# Patient Record
Sex: Male | Born: 1960
Health system: Southern US, Community
[De-identification: ages and names within clinical notes are randomized; demographics above are authoritative.]

## PROBLEM LIST (undated history)

## (undated) DIAGNOSIS — M1990 Unspecified osteoarthritis, unspecified site: Secondary | ICD-10-CM

## (undated) DIAGNOSIS — M51369 Other intervertebral disc degeneration, lumbar region without mention of lumbar back pain or lower extremity pain: Secondary | ICD-10-CM

## (undated) DIAGNOSIS — M5136 Other intervertebral disc degeneration, lumbar region: Secondary | ICD-10-CM

## (undated) DIAGNOSIS — I10 Essential (primary) hypertension: Secondary | ICD-10-CM

## (undated) HISTORY — PX: EXPLORATORY LAPAROTOMY: SUR591

## (undated) HISTORY — DX: Other intervertebral disc degeneration, lumbar region without mention of lumbar back pain or lower extremity pain: M51.369

## (undated) HISTORY — DX: Other intervertebral disc degeneration, lumbar region: M51.36

## (undated) HISTORY — PX: HERNIA REPAIR: SHX51

## (undated) HISTORY — DX: Essential (primary) hypertension: I10

---

## 2011-07-07 LAB — HM COLONOSCOPY

## 2012-07-08 DIAGNOSIS — I10 Essential (primary) hypertension: Secondary | ICD-10-CM | POA: Insufficient documentation

## 2015-07-18 DIAGNOSIS — R1032 Left lower quadrant pain: Secondary | ICD-10-CM | POA: Insufficient documentation

## 2016-03-20 DIAGNOSIS — M5136 Other intervertebral disc degeneration, lumbar region: Secondary | ICD-10-CM | POA: Insufficient documentation

## 2016-03-20 DIAGNOSIS — Z789 Other specified health status: Secondary | ICD-10-CM | POA: Insufficient documentation

## 2017-01-08 DIAGNOSIS — M48061 Spinal stenosis, lumbar region without neurogenic claudication: Secondary | ICD-10-CM | POA: Insufficient documentation

## 2017-08-22 DIAGNOSIS — E785 Hyperlipidemia, unspecified: Secondary | ICD-10-CM | POA: Insufficient documentation

## 2019-10-26 ENCOUNTER — Telehealth: Payer: Self-pay | Admitting: General Practice

## 2019-10-26 NOTE — Telephone Encounter (Signed)
Patient hung up on me before I can let him know that we have new patient paperwork that needs to be completed. Patient was rude when he called in and stated that he will be out of his BP medication. Stated he was going to try to get an emergency supply at urgent care and hung up.

## 2019-11-01 ENCOUNTER — Encounter: Payer: Self-pay | Admitting: Family Medicine

## 2019-11-01 ENCOUNTER — Ambulatory Visit (INDEPENDENT_AMBULATORY_CARE_PROVIDER_SITE_OTHER): Payer: Self-pay | Admitting: Family Medicine

## 2019-11-01 ENCOUNTER — Other Ambulatory Visit: Payer: Self-pay

## 2019-11-01 VITALS — BP 165/100 | HR 88 | Temp 97.6°F | Wt 225.0 lb

## 2019-11-01 DIAGNOSIS — M48061 Spinal stenosis, lumbar region without neurogenic claudication: Secondary | ICD-10-CM

## 2019-11-01 DIAGNOSIS — M25561 Pain in right knee: Secondary | ICD-10-CM | POA: Insufficient documentation

## 2019-11-01 DIAGNOSIS — M5416 Radiculopathy, lumbar region: Secondary | ICD-10-CM

## 2019-11-01 DIAGNOSIS — G8929 Other chronic pain: Secondary | ICD-10-CM

## 2019-11-01 DIAGNOSIS — I10 Essential (primary) hypertension: Secondary | ICD-10-CM

## 2019-11-01 MED ORDER — LISINOPRIL 10 MG PO TABS: 10.0000 mg | ORAL_TABLET | Freq: Every day | ORAL | 1 refills | Status: DC

## 2019-11-01 MED ORDER — BISOPROLOL-HYDROCHLOROTHIAZIDE 10-6.25 MG PO TABS: 1.0000 | ORAL_TABLET | Freq: Every day | ORAL | 1 refills | Status: DC

## 2019-11-01 NOTE — Assessment & Plan Note (Signed)
Recommend trying voltaren gel.  Also discussed trying injection, which he will think about once he is insured.

## 2019-11-01 NOTE — Patient Instructions (Addendum)
Clients coming to [facility] may have a number of questions.  If you have any additional questions regarding any aspect of the [facility] experience, please call us at [phone].  Have labs completed, we'll be in touch with results.   See me again in about 2 months.     Alcohol Abuse and Dependence Information, Adult Alcohol is a widely available drug. People drink alcohol in different amounts. People who drink alcohol very often and in large amounts often have problems during and after drinking. They may develop what is called an alcohol use disorder. There are two main types of alcohol use disorders:  Alcohol abuse. This is when you use alcohol too much or too often. You may use alcohol to make yourself feel happy or to reduce stress. You may have a hard time setting a limit on the amount you drink.  Alcohol dependence. This is when you use alcohol consistently for a period of time, and your body changes as a result. This can make it hard to stop drinking because you may start to feel sick or feel different when you do not use alcohol. These symptoms are known as withdrawal. How can alcohol abuse and dependence affect me? Alcohol abuse and dependence can have a negative effect on your life. Drinking too much can lead to addiction. You may feel like you need alcohol to function normally. You may drink alcohol before work in the morning, during the day, or as soon as you get home from work in the evening. These actions can result in:  Poor work performance.  Job loss.  Financial problems.  Car crashes or criminal charges from driving after drinking alcohol.  Problems in your relationships with friends and family.  Losing the trust and respect of coworkers, friends, and family. Drinking heavily over a long period of time can permanently damage your body and brain, and can cause lifelong health issues, such as:  Damage to your liver or pancreas.  Heart problems, high blood pressure, or  stroke.  Certain cancers.  Decreased ability to fight infections.  Brain or nerve damage.  Depression.  Early (premature) death. If you are careless or you crave alcohol, it is easy to drink more than your body can handle (overdose). Alcohol overdose is a serious situation that requires hospitalization. It may lead to permanent injuries or death. What can increase my risk?  Having a family history of alcohol abuse.  Having depression or other mental health conditions.  Beginning to drink at an early age.  Binge drinking often.  Experiencing trauma, stress, and an unstable home life during childhood.  Spending time with people who drink often. What actions can I take to prevent or manage alcohol abuse and dependence?  Do not drink alcohol if: ? Your health care provider tells you not to drink. ? You are pregnant, may be pregnant, or are planning to become pregnant.  If you drink alcohol: ? Limit how much you use to:  0-1 drink a day for women.  0-2 drinks a day for men. ? Be aware of how much alcohol is in your drink. In the [place] one drink equals one 12 oz bottle of beer (355 mL), one 5 oz glass of wine (148 mL), or one 1 oz glass of hard liquor (44 mL).  Stop drinking if you have been drinking too much. This can be very hard to do if you are used to abusing alcohol. If you begin to have withdrawal symptoms, talk with your health  care provider or a person that you trust. These symptoms may include anxiety, shaky hands, headache, nausea, sweating, or not being able to sleep.  Choose to drink nonalcoholic beverages in social gatherings and places where there may be alcohol. Activity  Spend more time on activities that you enjoy that do not involve alcohol, like hobbies or exercise.  Find healthy ways to cope with stress, such as exercise, meditation, or spending time with people you care about. General information  Talk to your family, coworkers, and friends about  supporting you in your efforts to stop drinking. If they drink, ask them not to drink around you. Spend more time with people who do not drink alcohol.  If you think that you have an alcohol dependency problem: ? Tell friends or family about your concerns. ? Talk with your health care provider or another health professional about where to get help. ? Work with a Transport planner and a Regulatory affairs officer. ? Consider joining a support group for people who struggle with alcohol abuse and dependence. Where to find support   Your health care provider.  SMART Recovery: www.smartrecovery.org Therapy and support groups  Local treatment centers or chemical dependency counselors.  Local AA groups in your community: NicTax.com.pt Where to find more information  Centers for Disease Control and Prevention: http://www.wolf.info/  National Institute on Alcohol Abuse and Alcoholism: http://www.bradshaw.com/  Alcoholics Anonymous (AA): NicTax.com.pt Contact a health care provider if:  You drank more or for longer than you intended on more than one occasion.  You tried to stop drinking or to cut back on how much you drink, but you were not able to.  You often drink to the point of vomiting or passing out.  You want to drink so badly that you cannot think about anything else.  You have problems in your life due to drinking, but you continue to drink.  You keep drinking even though you feel anxious, depressed, or have experienced memory loss.  You have stopped doing the things you used to enjoy in order to drink.  You have to drink more than you used to in order to get the effect you want.  You experience anxiety, sweating, nausea, shakiness, and trouble sleeping when you try to stop drinking. Get help right away if:  You have thoughts about hurting yourself or others.  You have serious withdrawal symptoms, including: ? Confusion. ? Racing heart. ? High blood pressure. ? Fever. If you ever feel like  you may hurt yourself or others, or have thoughts about taking your own life, get help right away. You can go to your nearest emergency department or call:  Your local emergency services (911 in the [place]  A suicide crisis helpline, such as the [place] at (332) 267-4142. This is open 24 hours a day. Summary  Alcohol abuse and dependence can have a negative effect on your life. Drinking too much or too often can lead to addiction.  If you drink alcohol, limit how much you use.  If you are having trouble keeping your drinking under control, find ways to change your behavior. Hobbies, calming activities, exercise, or support groups can help.  If you feel you need help with changing your drinking habits, talk with your health care provider, a good friend, or a therapist, or go to an [place] group. This information is not intended to replace advice given to you by your health care provider. Make sure you discuss any questions you have with your health  care provider. Document Revised: 07/26/2018 Document Reviewed: 06/14/2018 Elsevier Patient Education  [place].

## 2019-11-01 NOTE — Assessment & Plan Note (Signed)
He is controlling with anti-inflammatories as needed.  Has rx for gabapentin but doesn't use very often.

## 2019-11-01 NOTE — Progress Notes (Signed)
Ian [name] - 59 y.o. male MRN [id number]  Date of birth: 28-Dec-1960  Subjective Chief Complaint  Patient presents with  . Establish Care  . Medication Refill    HPI Ian [name] is a 59 y.o. male with history of HTN, chronic low back and knee pain here today for initial visit.    -HTN:  Current management with lisinopril 10mg  and bisoprolol-hctz.  He has been out of medication for a couple of days.  He denies side effects fro medication.  He has not had chest pain, shortness of breath, palpitations, headache or vision changes.   -Back pain:  He tells me that he is disabled due to his back pain.  Reports dx with DDD of the lumbar spine. He has tried anti-inflammatories and injections without much improvement previously.  He has never had surgery.    -Knee pain:  Pain in R knee with some intermittent stiffness and swelling.  Controls with OTC ibuprofen, this is helpful.  He has never tried injection.    ROS:  A comprehensive ROS was completed and negative except as noted per HPI  Not on File  Past Medical History:  Diagnosis Date  . Degenerative disc disease, lumbar   . Hypertension     Past Surgical History:  Procedure Laterality Date  . EXPLORATORY LAPAROTOMY      Social History   Socioeconomic History  . Marital status: Unknown    Spouse name: Not on file  . Number of children: Not on file  . Years of education: Not on file  . Highest education level: Not on file  Occupational History  . Not on file  Tobacco Use  . Smoking status: Former Research scientist (life sciences)  . Smokeless tobacco: Never Used  Vaping Use  . Vaping Use: Never used  Substance and Sexual Activity  . Alcohol use: Yes    Alcohol/week: 2.0 standard drinks    Types: 1 Shots of liquor, 1 Cans of beer per week    Comment: Daily  . Drug use: Never  . Sexual activity: Not Currently  Other Topics Concern  . Not on file  Social History Narrative  . Not on file   Social Determinants of Health   Financial Resource  Strain:   . Difficulty of Paying Living Expenses:   Food Insecurity:   . Worried About Charity fundraiser in the Last Year:   . Arboriculturist in the Last Year:   Transportation Needs:   . Film/video editor (Medical):   Marland Kitchen Lack of Transportation (Non-Medical):   Physical Activity:   . Days of Exercise per Week:   . Minutes of Exercise per Session:   Stress:   . Feeling of Stress :   Social Connections:   . Frequency of Communication with Friends and Family:   . Frequency of Social Gatherings with Friends and Family:   . Attends Religious Services:   . Active Member of Clubs or Organizations:   . Attends Archivist Meetings:   Marland Kitchen Marital Status:     Family History  Problem Relation Age of Onset  . Diabetes Mother   . Colon cancer Mother   . Hypertension Father   . Heart attack Father     Health Maintenance  Topic Date Due  . Hepatitis C Screening  Never done  . COVID-19 Vaccine (1) Never done  . HIV Screening  Never done  . TETANUS/TDAP  Never done  . COLONOSCOPY  Never done  . INFLUENZA  VACCINE  11/19/2019     ----------------------------------------------------------------------------------------------------------------------------------------------------------------------------------------------------------------- Physical Exam BP (!) 165/100 (BP Location: Right Arm, Patient Position: Sitting, Cuff Size: Large)   Pulse 88   Temp 97.6 F (36.4 C) (Oral)   Wt 225 lb (102.1 kg)   SpO2 99%   Physical Exam Constitutional:      Appearance: Normal appearance.  HENT:     Head: Normocephalic and atraumatic.  Eyes:     General: No scleral icterus. Cardiovascular:     Rate and Rhythm: Normal rate and regular rhythm.  Pulmonary:     Effort: Pulmonary effort is normal.     Breath sounds: Normal breath sounds.  Musculoskeletal:     Cervical back: Neck supple.     Comments: TTP along joint line of R knee. No effusion noted.   ROM is fairly good but  pain on extreme flexion.    Neurological:     General: No focal deficit present.     Mental Status: He is alert.  Psychiatric:        Mood and Affect: Mood normal.     ------------------------------------------------------------------------------------------------------------------------------------------------------------------------------------------------------------------- Assessment and Plan  Spinal stenosis of lumbar region with radiculopathy He is controlling with anti-inflammatories as needed.  Has rx for gabapentin but doesn't use very often.   Essential hypertension Blood pressure is not at goal at for age and co-morbidities. He is currently out of medications so I recommended that he restart these initially.  In addition they were instructed to follow a low sodium diet with regular exercise to help to maintain adequate control of blood pressure.    Right knee pain Recommend trying voltaren gel.  Also discussed trying injection, which he will think about once he is insured.     Meds ordered this encounter  Medications  . bisoprolol-hydrochlorothiazide (ZIAC) 10-6.25 MG tablet    Sig: Take 1 tablet by mouth daily.    Dispense:  90 tablet    Refill:  1  . lisinopril (ZESTRIL) 10 MG tablet    Sig: Take 1 tablet (10 mg total) by mouth daily.    Dispense:  90 tablet    Refill:  1    Return in about 2 months (around 01/02/2020) for HTN.    This visit occurred during the SARS-CoV-2 public health emergency.  Safety protocols were in place, including screening questions prior to the visit, additional usage of staff PPE, and extensive cleaning of exam room while observing appropriate contact time as indicated for disinfecting solutions.

## 2019-11-01 NOTE — Assessment & Plan Note (Signed)
Blood pressure is not at goal at for age and co-morbidities. He is currently out of medications so I recommended that he restart these initially.  In addition they were instructed to follow a low sodium diet with regular exercise to help to maintain adequate control of blood pressure.

## 2019-12-21 LAB — CBC
HCT: 47.6 % (ref 38.5–50.0)
Hemoglobin: 16.1 g/dL (ref 13.2–17.1)
MCH: 29.4 pg (ref 27.0–33.0)
MCHC: 33.8 g/dL (ref 32.0–36.0)
MCV: 87 fL (ref 80.0–100.0)
MPV: 11.1 fL (ref 7.5–12.5)
Platelets: 175 10*3/uL (ref 140–400)
RBC: 5.47 10*6/uL (ref 4.20–5.80)
RDW: 12.9 % (ref 11.0–15.0)
WBC: 5.6 10*3/uL (ref 3.8–10.8)

## 2019-12-21 LAB — COMPLETE METABOLIC PANEL WITH GFR
AG Ratio: 1.7 (calc) (ref 1.0–2.5)
ALT: 30 U/L (ref 9–46)
AST: 18 U/L (ref 10–35)
Albumin: 4.3 g/dL (ref 3.6–5.1)
Alkaline phosphatase (APISO): 51 U/L (ref 35–144)
BUN: 13 mg/dL (ref 7–25)
CO2: 30 mmol/L (ref 20–32)
Calcium: 9.8 mg/dL (ref 8.6–10.3)
Chloride: 104 mmol/L (ref 98–110)
Creat: 0.88 mg/dL (ref 0.70–1.33)
GFR, Est African American: 109 mL/min/{1.73_m2} (ref >=60)
GFR, Est Non African American: 94 mL/min/{1.73_m2} (ref >=60)
Globulin: 2.5 g/dL (calc) (ref 1.9–3.7)
Glucose, Bld: 91 mg/dL (ref 65–99)
Potassium: 4.4 mmol/L (ref 3.5–5.3)
Sodium: 140 mmol/L (ref 135–146)
Total Bilirubin: 0.4 mg/dL (ref 0.2–1.2)
Total Protein: 6.8 g/dL (ref 6.1–8.1)

## 2020-01-02 ENCOUNTER — Other Ambulatory Visit: Payer: Self-pay

## 2020-01-02 ENCOUNTER — Encounter: Payer: Self-pay | Admitting: Family Medicine

## 2020-01-02 ENCOUNTER — Ambulatory Visit (INDEPENDENT_AMBULATORY_CARE_PROVIDER_SITE_OTHER): Payer: Medicare Other | Admitting: Family Medicine

## 2020-01-02 VITALS — BP 117/78 | HR 60 | Wt 231.7 lb

## 2020-01-02 DIAGNOSIS — R351 Nocturia: Secondary | ICD-10-CM

## 2020-01-02 DIAGNOSIS — I10 Essential (primary) hypertension: Secondary | ICD-10-CM | POA: Diagnosis not present

## 2020-01-02 MED ORDER — LISINOPRIL 10 MG PO TABS: 10.0000 mg | ORAL_TABLET | Freq: Every day | ORAL | 1 refills | Status: DC

## 2020-01-02 MED ORDER — BISOPROLOL-HYDROCHLOROTHIAZIDE 10-6.25 MG PO TABS: 1.0000 | ORAL_TABLET | Freq: Every day | ORAL | 1 refills | Status: DC

## 2020-01-02 NOTE — Progress Notes (Signed)
Ian [name] - 59 y.o. male MRN [id number]  Date of birth: 03-25-1961  Subjective Chief Complaint  Patient presents with  . Hypertension    HPI Ian [name] is a 58 y.o. male here today for follow up of HTN.  He was out of BP medication at previous appt.  He has restarted on medications.  He is tolerating well and BP is much better controlled at this time.  He has also cut back on his EtOH intake which seems to help his BP as well.   He is concerned about his prostate due to urinary frequency at night .  He denies needing to strain or pain with urination.   ROS:  A comprehensive ROS was completed and negative except as noted per HPI  No Known Allergies  Past Medical History:  Diagnosis Date  . Degenerative disc disease, lumbar   . Hypertension     Past Surgical History:  Procedure Laterality Date  . EXPLORATORY LAPAROTOMY      Social History   Socioeconomic History  . Marital status: Unknown    Spouse name: Not on file  . Number of children: Not on file  . Years of education: Not on file  . Highest education level: Not on file  Occupational History  . Not on file  Tobacco Use  . Smoking status: Former Research scientist (life sciences)  . Smokeless tobacco: Never Used  Vaping Use  . Vaping Use: Never used  Substance and Sexual Activity  . Alcohol use: Yes    Alcohol/week: 2.0 standard drinks    Types: 1 Shots of liquor, 1 Cans of beer per week    Comment: Daily  . Drug use: Never  . Sexual activity: Not Currently  Other Topics Concern  . Not on file  Social History Narrative  . Not on file   Social Determinants of Health   Financial Resource Strain:   . Difficulty of Paying Living Expenses: Not on file  Food Insecurity:   . Worried About Charity fundraiser in the Last Year: Not on file  . Ran Out of Food in the Last Year: Not on file  Transportation Needs:   . Lack of Transportation (Medical): Not on file  . Lack of Transportation (Non-Medical): Not on file  Physical Activity:    . Days of Exercise per Week: Not on file  . Minutes of Exercise per Session: Not on file  Stress:   . Feeling of Stress : Not on file  Social Connections:   . Frequency of Communication with Friends and Family: Not on file  . Frequency of Social Gatherings with Friends and Family: Not on file  . Attends Religious Services: Not on file  . Active Member of Clubs or Organizations: Not on file  . Attends Archivist Meetings: Not on file  . Marital Status: Not on file    Family History  Problem Relation Age of Onset  . Diabetes Mother   . Colon cancer Mother   . Hypertension Father   . Heart attack Father     Health Maintenance  Topic Date Due  . Hepatitis C Screening  Never done  . COVID-19 Vaccine (1) Never done  . HIV Screening  Never done  . TETANUS/TDAP  Never done  . COLONOSCOPY  Never done  . INFLUENZA VACCINE  11/19/2019     ----------------------------------------------------------------------------------------------------------------------------------------------------------------------------------------------------------------- Physical Exam BP 117/78 (BP Location: Left Arm, Patient Position: Sitting, Cuff Size: Normal)   Pulse 60   Wt 231 lb  11.2 oz (105.1 kg)   SpO2 94%   Physical Exam Constitutional:      Appearance: Normal appearance.  HENT:     Head: Normocephalic and atraumatic.  Eyes:     General: No scleral icterus. Cardiovascular:     Rate and Rhythm: Normal rate and regular rhythm.  Pulmonary:     Effort: Pulmonary effort is normal.     Breath sounds: Normal breath sounds.  Musculoskeletal:     Cervical back: Neck supple.  Skin:    General: Skin is warm and dry.  Neurological:     General: No focal deficit present.     Mental Status: He is alert.  Psychiatric:        Mood and Affect: Mood normal.        Behavior: Behavior normal.      ------------------------------------------------------------------------------------------------------------------------------------------------------------------------------------------------------------------- Assessment and Plan  Essential hypertension Blood pressure is at goal at for age and co-morbidities.  I recommend continuation of current medications.  In addition they were instructed to follow a low sodium diet with regular exercise to help to maintain adequate control of blood pressure.    Nocturia Check PSA today.  Discussed adding flomax as well.     Meds ordered this encounter  Medications  . bisoprolol-hydrochlorothiazide (ZIAC) 10-6.25 MG tablet    Sig: Take 1 tablet by mouth daily.    Dispense:  90 tablet    Refill:  1  . lisinopril (ZESTRIL) 10 MG tablet    Sig: Take 1 tablet (10 mg total) by mouth daily.    Dispense:  90 tablet    Refill:  1   Orders Placed This Encounter  Procedures  . PSA     Return in about 6 months (around 07/01/2020) for HTN.    This visit occurred during the SARS-CoV-2 public health emergency.  Safety protocols were in place, including screening questions prior to the visit, additional usage of staff PPE, and extensive cleaning of exam room while observing appropriate contact time as indicated for disinfecting solutions.

## 2020-01-02 NOTE — Assessment & Plan Note (Signed)
Blood pressure is at goal at for age and co-morbidities.  I recommend continuation of current medications.  In addition they were instructed to follow a low sodium diet with regular exercise to help to maintain adequate control of blood pressure.  ? ?

## 2020-01-02 NOTE — Assessment & Plan Note (Signed)
Check PSA today.  Discussed adding flomax as well.

## 2020-01-03 LAB — PSA: PSA: 0.66 ng/mL (ref <=4.0)

## 2020-07-01 ENCOUNTER — Encounter (INDEPENDENT_AMBULATORY_CARE_PROVIDER_SITE_OTHER): Payer: Self-pay

## 2020-07-01 ENCOUNTER — Other Ambulatory Visit: Payer: Self-pay

## 2020-07-01 ENCOUNTER — Encounter: Payer: Self-pay | Admitting: Family Medicine

## 2020-07-01 ENCOUNTER — Ambulatory Visit (INDEPENDENT_AMBULATORY_CARE_PROVIDER_SITE_OTHER): Payer: Medicare Other | Admitting: Family Medicine

## 2020-07-01 DIAGNOSIS — I10 Essential (primary) hypertension: Secondary | ICD-10-CM

## 2020-07-01 MED ORDER — LISINOPRIL 10 MG PO TABS: 10.0000 mg | ORAL_TABLET | Freq: Every day | ORAL | 2 refills | Status: DC

## 2020-07-01 MED ORDER — BISOPROLOL-HYDROCHLOROTHIAZIDE 10-6.25 MG PO TABS: 1.0000 | ORAL_TABLET | Freq: Every day | ORAL | 2 refills | Status: DC

## 2020-07-01 NOTE — Assessment & Plan Note (Signed)
Blood pressure is at goal at for age and co-morbidities.  I recommend continuation of current medication.  In addition they were instructed to follow a low sodium diet with regular exercise to help to maintain adequate control of blood pressure.

## 2020-07-01 NOTE — Patient Instructions (Signed)
Nice to see you today!  Please continue current medications.   See me again in about 6 months.

## 2020-07-01 NOTE — Progress Notes (Signed)
Ian [name] - 60 y.o. male MRN [id number]  Date of birth: [birth year]  Subjective Chief Complaint  Patient presents with  . Hypertension    HPI Ian [name] is a 60 y.o. male here today for follow up of HTN.  He reports that he is doing well with current medications of bisoprolol/hctz and lisinopril.  He has not had side effects related to medication.  He denies symptoms of HTN including chest pain, shortness of breath, palpitations, headache or vision changes.    ROS:  A comprehensive ROS was completed and negative except as noted per HPI  No Known Allergies  Past Medical History:  Diagnosis Date  . Degenerative disc disease, lumbar   . Hypertension     Past Surgical History:  Procedure Laterality Date  . EXPLORATORY LAPAROTOMY      Social History   Socioeconomic History  . Marital status: Unknown    Spouse name: Not on file  . Number of children: Not on file  . Years of education: Not on file  . Highest education level: Not on file  Occupational History  . Not on file  Tobacco Use  . Smoking status: Former Research scientist (life sciences)  . Smokeless tobacco: Never Used  Vaping Use  . Vaping Use: Never used  Substance and Sexual Activity  . Alcohol use: Yes    Alcohol/week: 2.0 standard drinks    Types: 1 Shots of liquor, 1 Cans of beer per week    Comment: Daily  . Drug use: Never  . Sexual activity: Not Currently  Other Topics Concern  . Not on file  Social History Narrative  . Not on file   Social Determinants of Health   Financial Resource Strain: Not on file  Food Insecurity: Not on file  Transportation Needs: Not on file  Physical Activity: Not on file  Stress: Not on file  Social Connections: Not on file    Family History  Problem Relation Age of Onset  . Diabetes Mother   . Colon cancer Mother   . Hypertension Father   . Heart attack Father     Health Maintenance  Topic Date Due  . Hepatitis C Screening  Never done  . HIV Screening  Never done  .  COLONOSCOPY (Pts 45-71yrs Insurance coverage will need to be confirmed)  Never done  . COVID-19 Vaccine (1) 07/17/2020 (Originally 03/11/1966)  . INFLUENZA VACCINE  07/18/2020 (Originally 11/19/2019)  . TETANUS/TDAP  07/01/2021 (Originally 03/12/1979)  . HPV VACCINES  Aged Out     ----------------------------------------------------------------------------------------------------------------------------------------------------------------------------------------------------------------- Physical Exam BP 138/79 (BP Location: Left Arm, Patient Position: Sitting, Cuff Size: Large)   Pulse 72   Temp 97.6 F (36.4 C)   Ht 5' 8.31" (1.735 m)   Wt 235 lb 12.8 oz (107 kg)   SpO2 97%   BMI 35.53 kg/m   Physical Exam Constitutional:      Appearance: Normal appearance.  Cardiovascular:     Rate and Rhythm: Normal rate and regular rhythm.  Pulmonary:     Effort: Pulmonary effort is normal.     Breath sounds: Normal breath sounds.  Musculoskeletal:     Cervical back: Neck supple.  Neurological:     General: No focal deficit present.     Mental Status: He is alert.  Psychiatric:        Mood and Affect: Mood normal.        Behavior: Behavior normal.     ------------------------------------------------------------------------------------------------------------------------------------------------------------------------------------------------------------------- Assessment and Plan  Essential hypertension Blood pressure is at goal  at for age and co-morbidities.  I recommend continuation of current medication.  In addition they were instructed to follow a low sodium diet with regular exercise to help to maintain adequate control of blood pressure.     Meds ordered this encounter  Medications  . bisoprolol-hydrochlorothiazide (ZIAC) 10-6.25 MG tablet    Sig: Take 1 tablet by mouth daily.    Dispense:  90 tablet    Refill:  2  . lisinopril (ZESTRIL) 10 MG tablet    Sig: Take 1 tablet  (10 mg total) by mouth daily.    Dispense:  90 tablet    Refill:  2    Return in about 6 months (around 01/01/2021) for HTN.    This visit occurred during the SARS-CoV-2 public health emergency.  Safety protocols were in place, including screening questions prior to the visit, additional usage of staff PPE, and extensive cleaning of exam room while observing appropriate contact time as indicated for disinfecting solutions.

## 2020-08-15 DIAGNOSIS — N400 Enlarged prostate without lower urinary tract symptoms: Secondary | ICD-10-CM | POA: Insufficient documentation

## 2021-01-01 ENCOUNTER — Encounter: Payer: Self-pay | Admitting: Family Medicine

## 2021-01-01 ENCOUNTER — Ambulatory Visit (INDEPENDENT_AMBULATORY_CARE_PROVIDER_SITE_OTHER): Payer: Medicare Other | Admitting: Family Medicine

## 2021-01-01 VITALS — BP 123/77 | HR 58 | Temp 97.7°F | Ht 68.0 in | Wt 232.0 lb

## 2021-01-01 DIAGNOSIS — N401 Enlarged prostate with lower urinary tract symptoms: Secondary | ICD-10-CM

## 2021-01-01 DIAGNOSIS — E7800 Pure hypercholesterolemia, unspecified: Secondary | ICD-10-CM

## 2021-01-01 DIAGNOSIS — R350 Frequency of micturition: Secondary | ICD-10-CM

## 2021-01-01 DIAGNOSIS — D485 Neoplasm of uncertain behavior of skin: Secondary | ICD-10-CM

## 2021-01-01 DIAGNOSIS — I10 Essential (primary) hypertension: Secondary | ICD-10-CM

## 2021-01-01 MED ORDER — BISOPROLOL-HYDROCHLOROTHIAZIDE 10-6.25 MG PO TABS: 1.0000 | ORAL_TABLET | Freq: Every day | ORAL | 2 refills | Status: DC

## 2021-01-01 MED ORDER — LISINOPRIL 10 MG PO TABS: 10.0000 mg | ORAL_TABLET | Freq: Every day | ORAL | 2 refills | Status: DC

## 2021-01-01 NOTE — Assessment & Plan Note (Signed)
BP is well controlled with current medications.  Continue medications at current strength.  Low sodium diet encouraged. Return in about 6 months (around 07/01/2021) for HTN.

## 2021-01-01 NOTE — Assessment & Plan Note (Signed)
Seeing urology.  Stable with tamsulosin.

## 2021-01-01 NOTE — Progress Notes (Signed)
Ian [name] - 60 y.o. male MRN [id number]  Date of birth: 07/15/1960  Subjective Chief Complaint  Patient presents with   Hypertension    HPI Ian [name] is a 60 y.o. male here today for follow up visit.    He is doing well with current medications for management of HTN.  No side effects noted.  He denies symptoms related to HTN including chest pain, shortness of breath, palpitations, headache or vision changes.    He has some skin lesions on his face and would like referral to dermatology for treatment of these.    LUT's managed with tamsulosin.  Followed by urology for BPH  ROS:  A comprehensive ROS was completed and negative except as noted per HPI  No Known Allergies  Past Medical History:  Diagnosis Date   Degenerative disc disease, lumbar    Hypertension     Past Surgical History:  Procedure Laterality Date   EXPLORATORY LAPAROTOMY      Social History   Socioeconomic History   Marital status: Unknown    Spouse name: Not on file   Number of children: Not on file   Years of education: Not on file   Highest education level: Not on file  Occupational History   Not on file  Tobacco Use   Smoking status: Former   Smokeless tobacco: Never  Vaping Use   Vaping Use: Never used  Substance and Sexual Activity   Alcohol use: Yes    Alcohol/week: 2.0 standard drinks    Types: 1 Shots of liquor, 1 Cans of beer per week    Comment: Daily   Drug use: Never   Sexual activity: Not Currently  Other Topics Concern   Not on file  Social History Narrative   Not on file   Social Determinants of Health   Financial Resource Strain: Not on file  Food Insecurity: Not on file  Transportation Needs: Not on file  Physical Activity: Not on file  Stress: Not on file  Social Connections: Not on file    Family History  Problem Relation Age of Onset   Diabetes Mother    Colon cancer Mother    Hypertension Father    Heart attack Father     Health Maintenance  Topic  Date Due   COVID-19 Vaccine (1) Never done   Pneumococcal Vaccine 42-77 Years old (1 - PCV) Never done   HIV Screening  Never done   Hepatitis C Screening  Never done   Zoster Vaccines- Shingrix (1 of 2) Never done   COLONOSCOPY (Pts 45-72yr Insurance coverage will need to be confirmed)  Never done   INFLUENZA VACCINE  11/18/2020   TETANUS/TDAP  07/01/2021 (Originally 03/12/1979)   HPV VACCINES  Aged Out     ----------------------------------------------------------------------------------------------------------------------------------------------------------------------------------------------------------------- Physical Exam BP 123/77 (BP Location: Left Arm, Patient Position: Sitting, Cuff Size: Normal)   Pulse (!) 58   Temp 97.7 F (36.5 C)   Ht '5\' 8"'$  (1.727 m)   Wt 232 lb (105.2 kg)   SpO2 97%   BMI 35.28 kg/m   Physical Exam Constitutional:      Appearance: Normal appearance.  Eyes:     General: No scleral icterus. Cardiovascular:     Rate and Rhythm: Normal rate and regular rhythm.  Musculoskeletal:     Cervical back: Neck supple.  Neurological:     General: No focal deficit present.     Mental Status: He is alert.  Psychiatric:  Mood and Affect: Mood normal.        Behavior: Behavior normal.    ------------------------------------------------------------------------------------------------------------------------------------------------------------------------------------------------------------------- Assessment and Plan  Essential hypertension BP is well controlled with current medications.  Continue medications at current strength.  Low sodium diet encouraged. Return in about 6 months (around 07/01/2021) for HTN.   Hyperlipidemia Update lipid panel today.   BPH (benign prostatic hyperplasia) Seeing urology.  Stable with tamsulosin.    Meds ordered this encounter  Medications   lisinopril (ZESTRIL) 10 MG tablet    Sig: Take 1 tablet (10 mg  total) by mouth daily.    Dispense:  90 tablet    Refill:  2   bisoprolol-hydrochlorothiazide (ZIAC) 10-6.25 MG tablet    Sig: Take 1 tablet by mouth daily.    Dispense:  90 tablet    Refill:  2    Return in about 6 months (around 07/01/2021) for HTN.    This visit occurred during the SARS-CoV-2 public health emergency.  Safety protocols were in place, including screening questions prior to the visit, additional usage of staff PPE, and extensive cleaning of exam room while observing appropriate contact time as indicated for disinfecting solutions.

## 2021-01-01 NOTE — Assessment & Plan Note (Signed)
Update lipid panel today 

## 2021-01-15 DIAGNOSIS — E7800 Pure hypercholesterolemia, unspecified: Secondary | ICD-10-CM | POA: Diagnosis not present

## 2021-01-15 DIAGNOSIS — I10 Essential (primary) hypertension: Secondary | ICD-10-CM | POA: Diagnosis not present

## 2021-01-16 LAB — CBC WITH DIFFERENTIAL/PLATELET
Absolute Monocytes: 473 cells/uL (ref 200–950)
Basophils Absolute: 22 cells/uL (ref 0–200)
Basophils Relative: 0.4 %
Eosinophils Absolute: 110 cells/uL (ref 15–500)
Eosinophils Relative: 2 %
HCT: 46.8 % (ref 38.5–50.0)
Hemoglobin: 16.1 g/dL (ref 13.2–17.1)
Lymphs Abs: 1507 cells/uL (ref 850–3900)
MCH: 29.7 pg (ref 27.0–33.0)
MCHC: 34.4 g/dL (ref 32.0–36.0)
MCV: 86.3 fL (ref 80.0–100.0)
MPV: 11.7 fL (ref 7.5–12.5)
Monocytes Relative: 8.6 %
Neutro Abs: 3388 cells/uL (ref 1500–7800)
Neutrophils Relative %: 61.6 %
Platelets: 139 10*3/uL — ABNORMAL LOW (ref 140–400)
RBC: 5.42 10*6/uL (ref 4.20–5.80)
RDW: 13 % (ref 11.0–15.0)
Total Lymphocyte: 27.4 %
WBC: 5.5 10*3/uL (ref 3.8–10.8)

## 2021-01-16 LAB — COMPLETE METABOLIC PANEL WITH GFR
AG Ratio: 1.8 (calc) (ref 1.0–2.5)
ALT: 21 U/L (ref 9–46)
AST: 17 U/L (ref 10–35)
Albumin: 4.3 g/dL (ref 3.6–5.1)
Alkaline phosphatase (APISO): 53 U/L (ref 35–144)
BUN: 16 mg/dL (ref 7–25)
CO2: 30 mmol/L (ref 20–32)
Calcium: 9.8 mg/dL (ref 8.6–10.3)
Chloride: 104 mmol/L (ref 98–110)
Creat: 0.85 mg/dL (ref 0.70–1.35)
Globulin: 2.4 g/dL (calc) (ref 1.9–3.7)
Glucose, Bld: 93 mg/dL (ref 65–139)
Potassium: 4.6 mmol/L (ref 3.5–5.3)
Sodium: 140 mmol/L (ref 135–146)
Total Bilirubin: 0.4 mg/dL (ref 0.2–1.2)
Total Protein: 6.7 g/dL (ref 6.1–8.1)
eGFR: 99 mL/min/{1.73_m2} (ref >=60)

## 2021-01-16 LAB — LIPID PANEL W/REFLEX DIRECT LDL
Cholesterol: 191 mg/dL (ref <200)
HDL: 36 mg/dL — ABNORMAL LOW (ref >=40)
LDL Cholesterol (Calc): 115 mg/dL (calc) — ABNORMAL HIGH
Non-HDL Cholesterol (Calc): 155 mg/dL (calc) — ABNORMAL HIGH (ref <130)
Total CHOL/HDL Ratio: 5.3 (calc) — ABNORMAL HIGH (ref <5.0)
Triglycerides: 283 mg/dL — ABNORMAL HIGH (ref <150)

## 2021-02-19 ENCOUNTER — Emergency Department (INDEPENDENT_AMBULATORY_CARE_PROVIDER_SITE_OTHER): Payer: Medicare Other

## 2021-02-19 ENCOUNTER — Emergency Department (INDEPENDENT_AMBULATORY_CARE_PROVIDER_SITE_OTHER)
Admission: EM | Admit: 2021-02-19 | Discharge: 2021-02-19 | Disposition: A | Discharge status: Home / Self Care | Payer: Medicare Other | Source: Home / Self Care | Attending: Family Medicine | Admitting: Family Medicine

## 2021-02-19 ENCOUNTER — Other Ambulatory Visit: Payer: Self-pay

## 2021-02-19 DIAGNOSIS — M7022 Olecranon bursitis, left elbow: Secondary | ICD-10-CM | POA: Diagnosis not present

## 2021-02-19 DIAGNOSIS — M25522 Pain in left elbow: Secondary | ICD-10-CM

## 2021-02-19 MED ORDER — IBUPROFEN 800 MG PO TABS: 800.0000 mg | ORAL_TABLET | Freq: Three times a day (TID) | ORAL | 0 refills | Status: DC

## 2021-02-19 NOTE — ED Triage Notes (Signed)
Pt c/o LT elbow pain x 1 month. Says he was washing his truck when he slipped and fell. Had an abrasion that has healed. Last night noticed a fluid fill sack on same elbow. Painful to touch. Pain 1/10
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
## 2021-02-19 NOTE — Discharge Instructions (Addendum)
Consider getting an elbow wrap or sleeve Use ice or heat to area Take ibuprofen 3 times a day with food.  This is an anti-inflammatory to take down the swelling and soreness Follow-up with Dr. Zigmund Daniel, or Dr. Dianah Field in the same office if you fail to improve

## 2021-03-25 DIAGNOSIS — R399 Unspecified symptoms and signs involving the genitourinary system: Secondary | ICD-10-CM | POA: Insufficient documentation

## 2021-04-04 ENCOUNTER — Ambulatory Visit (INDEPENDENT_AMBULATORY_CARE_PROVIDER_SITE_OTHER): Payer: Medicare Other | Admitting: Family Medicine

## 2021-04-04 DIAGNOSIS — Z0000 Encounter for general adult medical examination without abnormal findings: Secondary | ICD-10-CM | POA: Diagnosis not present

## 2021-04-04 NOTE — Patient Instructions (Addendum)
Ian [name]  Ian [name] ,  Thank you for allowing me to perform your Medicare Annual Wellness Visit and for your ongoing commitment to your health.   Health Maintenance & Immunization History Health Maintenance  Topic Date Due   COVID-19 Vaccine (1) 04/20/2021 (Originally 09/08/1961)   TETANUS/TDAP  07/01/2021 (Originally 03/12/1979)   Zoster Vaccines- Shingrix (1 of 2) 07/03/2021 (Originally 03/12/1979)   INFLUENZA VACCINE  07/18/2021 (Originally 11/18/2020)   Pneumococcal Vaccine 63-45 Years old (1 - PCV) 04/04/2022 (Originally 03/11/1966)   COLONOSCOPY (Pts 45-66yrs Insurance coverage will need to be confirmed)  04/04/2022 (Originally 03/11/2005)   Hepatitis C Screening  04/04/2022 (Originally 03/11/1978)   HIV Screening  04/04/2022 (Originally 03/12/1975)   HPV VACCINES  Aged Out   Immunization History  Administered Date(s) Administered   Influenza,inj,Quad PF,6+ Mos 02/22/2018    These are the patient goals that we discussed:  Goals Addressed               This Visit's Progress     Patient Stated (pt-stated)        04/04/2021 AWV Goal: Exercise for General Health  Patient will verbalize understanding of the benefits of increased physical activity: Exercising regularly is important. It will improve your overall fitness, flexibility, and endurance. Regular exercise also will improve your overall health. It can help you control your weight, reduce stress, and improve your bone density. Over the next year, patient will increase physical activity as tolerated with a goal of at least 150 minutes of moderate physical activity per week.  You can tell that you are exercising at a moderate intensity if your heart starts beating faster and you start breathing faster but can still hold a conversation. Moderate-intensity exercise ideas include: Walking 1 mile (1.6 km) in about 15  minutes Biking Hiking Golfing Dancing Water aerobics Patient will verbalize understanding of everyday activities that increase physical activity by providing examples like the following: Yard work, such as: Sales promotion account executive Gardening Washing windows or floors Patient will be able to explain general safety guidelines for exercising:  Before you start a new exercise program, talk with your health [name] provider. Do not exercise so much that you hurt yourself, feel dizzy, or get very short of breath. Wear comfortable clothes and wear shoes with good support. Drink plenty of water while you exercise to prevent dehydration or heat stroke. Work out until your breathing and your heartbeat get faster.          This is a list of Health Maintenance Items that are overdue or due now: Pneumococcal vaccine  Influenza vaccine Td vaccine Colorectal cancer screening Shingrix Covid vaccines  Patient declined all of the vaccines at this time.  States he had a colonoscopy a few years ago at [facility] in [place], [place].   Orders/Referrals Placed Today: No orders of the defined types were placed in this encounter.  (Contact our referral department at [phone] if you have not spoken with someone about your referral appointment within the next 5 days)    Follow-up Plan Follow-up with Ian Nutting, DO as planned We will get you to sign a medical record release to get your records from [facility] to get your Colonoscopy report. Medicare wellness visit in one year.  AVS printed and mailed to the patient.      Health Maintenance, Male  Adopting a healthy lifestyle and getting preventive [name] are important in promoting health and wellness. Ask your health [name] provider about: The right schedule for you to have regular tests and exams. Things you can do on your own to  prevent diseases and keep yourself healthy. What should I know about diet, weight, and exercise? Eat a healthy diet  Eat a diet that includes plenty of vegetables, fruits, low-fat dairy products, and lean protein. Do not eat a lot of foods that are high in solid fats, added sugars, or sodium. Maintain a healthy weight Body mass index (BMI) is a measurement that can be used to identify possible weight problems. It estimates body fat based on height and weight. Your health [name] provider can help determine your BMI and help you achieve or maintain a healthy weight. Get regular exercise Get regular exercise. This is one of the most important things you can do for your health. Most adults should: Exercise for at least 150 minutes each week. The exercise should increase your heart rate and make you sweat (moderate-intensity exercise). Do strengthening exercises at least twice a week. This is in addition to the moderate-intensity exercise. Spend less time sitting. Even light physical activity can be beneficial. Watch cholesterol and blood lipids Have your blood tested for lipids and cholesterol at 61 years of age, then have this test every 5 years. You may need to have your cholesterol levels checked more often if: Your lipid or cholesterol levels are high. You are older than 61 years of age. You are at high risk for heart disease. What should I know about cancer screening? Many types of cancers can be detected early and may often be prevented. Depending on your health history and family history, you may need to have cancer screening at various ages. This may include screening for: Colorectal cancer. Prostate cancer. Skin cancer. Lung cancer. What should I know about heart disease, diabetes, and high blood pressure? Blood pressure and heart disease High blood pressure causes heart disease and increases the risk of stroke. This is more likely to develop in people who have high blood pressure  readings or are overweight. Talk with your health [name] provider about your target blood pressure readings. Have your blood pressure checked: Every 3-5 years if you are 54-7 years of age. Every year if you are 61 years old or older. If you are between the ages of 70 and 32 and are a current or former smoker, ask your health [name] provider if you should have a one-time screening for abdominal aortic aneurysm (AAA). Diabetes Have regular diabetes screenings. This checks your fasting blood sugar level. Have the screening done: Once every three years after age 54 if you are at a normal weight and have a low risk for diabetes. More often and at a younger age if you are overweight or have a high risk for diabetes. What should I know about preventing infection? Hepatitis B If you have a higher risk for hepatitis B, you should be screened for this virus. Talk with your health [name] provider to find out if you are at risk for hepatitis B infection. Hepatitis C Blood testing is recommended for: Everyone born from [birth year] through 1966. Anyone with known risk factors for hepatitis C. Sexually transmitted infections (STIs) You should be screened each year for STIs, including gonorrhea and chlamydia, if: You are sexually active and are younger than 61 years of age. You are older than 61 years of age and your health [name]  provider tells you that you are at risk for this type of infection. Your sexual activity has changed since you were last screened, and you are at increased risk for chlamydia or gonorrhea. Ask your health [name] provider if you are at risk. Ask your health [name] provider about whether you are at high risk for HIV. Your health [name] provider may recommend a prescription medicine to help prevent HIV infection. If you choose to take medicine to prevent HIV, you should first get tested for HIV. You should then be tested every 3 months for as long as you are taking the medicine. Follow these instructions  at home: Alcohol use Do not drink alcohol if your health [name] provider tells you not to drink. If you drink alcohol: Limit how much you have to 0-2 drinks a day. Know how much alcohol is in your drink. In the [place] one drink equals one 12 oz bottle of beer (355 mL), one 5 oz glass of wine (148 mL), or one 1 oz glass of hard liquor (44 mL). Lifestyle Do not use any products that contain nicotine or tobacco. These products include cigarettes, chewing tobacco, and vaping devices, such as e-cigarettes. If you need help quitting, ask your health [name] provider. Do not use street drugs. Do not share needles. Ask your health [name] provider for help if you need support or information about quitting drugs. General instructions Schedule regular health, dental, and eye exams. Stay current with your vaccines. Tell your health [name] provider if: You often feel depressed. You have ever been abused or do not feel safe at home. Summary Adopting a healthy lifestyle and getting preventive [name] are important in promoting health and wellness. Follow your health [name] provider's instructions about healthy diet, exercising, and getting tested or screened for diseases. Follow your health [name] provider's instructions on monitoring your cholesterol and blood pressure. This information is not intended to replace advice given to you by your health [name] provider. Make sure you discuss any questions you have with your health [name] provider. Document Revised: 08/26/2020 Document Reviewed: 08/26/2020 Elsevier Patient Education  [place].

## 2021-04-04 NOTE — Progress Notes (Addendum)
MEDICARE ANNUAL WELLNESS VISIT  04/04/2021  Telephone Visit Disclaimer This Medicare AWV was conducted by telephone due to national recommendations for restrictions regarding the COVID-19 Pandemic (e.g. social distancing).  I verified, using two identifiers, that I am speaking with Ruthine Dose or their authorized healthcare agent. I discussed the limitations, risks, security, and privacy concerns of performing an evaluation and management service by telephone and the potential availability of an in-person appointment in the future. The patient expressed understanding and agreed to proceed.  Location of Patient: Home Location of Provider (nurse):  In the office.  Subjective:    Oshua Mcconaha is a 60 y.o. male patient of Luetta Nutting, DO who had a Medicare Annual Wellness Visit today via telephone. Brian is Legally disabled and lives alone. he has 2 children. he reports that he is socially active and does interact with friends/family regularly. he is minimally physically active and enjoys playing cards.  Patient Care Team: Luetta Nutting, DO as PCP - General (Family Medicine)  Advanced Directives 04/04/2021 11/01/2019  Does Patient Have a Medical Advance Directive? No No  Would patient like information on creating a medical advance directive? No - Patient declined No - Patient declined    Hospital Utilization Over the Past 12 Months: # of hospitalizations or ER visits: 0 # of surgeries: 0  Review of Systems    Patient reports that his overall health is unchanged compared to last year.  History obtained from chart review and the patient  Patient Reported Readings (BP, Pulse, CBG, Weight, etc) none  Pain Assessment Pain : No/denies pain     Current Medications & Allergies (verified) Allergies as of 04/04/2021   No Known Allergies      Medication List        Accurate as of April 04, 2021  9:28 AM. If you have any questions, ask your nurse or doctor.           Ascorbic Acid 500 MG Chew Chew by mouth.   bisoprolol-hydrochlorothiazide 10-6.25 MG tablet Commonly known as: ZIAC Take 1 tablet by mouth daily.   ibuprofen 800 MG tablet Commonly known as: ADVIL Take 1 tablet (800 mg total) by mouth 3 (three) times daily.   lisinopril 10 MG tablet Commonly known as: ZESTRIL Take 1 tablet (10 mg total) by mouth daily.   tamsulosin 0.4 MG Caps capsule Commonly known as: FLOMAX Take 0.4 mg by mouth daily.   VITAMIN A PO Take by mouth.   Vitamin D3 25 MCG tablet Commonly known as: Vitamin D Take by mouth.   zinc gluconate 50 MG tablet Take 50 mg by mouth daily.        History (reviewed): Past Medical History:  Diagnosis Date   Degenerative disc disease, lumbar    Hypertension    Past Surgical History:  Procedure Laterality Date   EXPLORATORY LAPAROTOMY     Family History  Problem Relation Age of Onset   Diabetes Mother    Colon cancer Mother    Hypertension Father    Heart attack Father    Social History   Socioeconomic History   Marital status: Unknown    Spouse name: Not on file   Number of children: 2   Years of education: 50   Highest education level: 12th grade  Occupational History   Occupation: Legally disabled.  Tobacco Use   Smoking status: Former   Smokeless tobacco: Never  Vaping Use   Vaping Use: Never used  Substance and Sexual Activity  Alcohol use: Not Currently    Alcohol/week: 2.0 standard drinks    Types: 1 Cans of beer, 1 Shots of liquor per week    Comment: Daily   Drug use: Never   Sexual activity: Not Currently  Other Topics Concern   Not on file  Social History Narrative   Lives alone. He has two daughters. He enjoys playing cards.   Social Determinants of Health   Financial Resource Strain: Low Risk    Difficulty of Paying Living Expenses: Not hard at all  Food Insecurity: No Food Insecurity   Worried About Charity fundraiser in the Last Year: Never true   [place]  in the Last Year: Never true  Transportation Needs: No Transportation Needs   Lack of Transportation (Medical): No   Lack of Transportation (Non-Medical): No  Physical Activity: Inactive   Days of Exercise per Week: 0 days   Minutes of Exercise per Session: 0 min  Stress: No Stress Concern Present   Feeling of Stress : Not at all  Social Connections: Moderately Integrated   Frequency of Communication with Friends and Family: Twice a week   Frequency of Social Gatherings with Friends and Family: Twice a week   Attends Religious Services: More than 4 times per year   Active Member of [employer] or Organizations: No   Attends Archivist Meetings: Never   Marital Status: Living with partner    Activities of Daily Living In your present state of health, do you have any difficulty performing the following activities: 04/04/2021  Hearing? N  Vision? Y  Comment feels that he needs new glasses.  Difficulty concentrating or making decisions? N  Walking or climbing stairs? N  Dressing or bathing? Y  Comment sometimes due to his lower back pain.  Doing errands, shopping? N  Preparing Food and eating ? N  Using the Toilet? N  In the past six months, have you accidently leaked urine? N  Do you have problems with loss of bowel control? N  Managing your Medications? N  Managing your Finances? N  Housekeeping or managing your Housekeeping? N  Some recent data might be hidden    Patient Education/ Literacy How often do you need to have someone help you when you read instructions, pamphlets, or other written materials from your doctor or pharmacy?: 1 - Never What is the last grade level you completed in school?: 12th grade  Exercise Current Exercise Habits: Home exercise routine, Type of exercise: walking, Time (Minutes): 30, Frequency (Times/Week): 7, Weekly Exercise (Minutes/Week): 210, Intensity: Mild, Exercise limited by: orthopedic condition(s) (chronic back pain.)  Diet Patient  reports consuming 2 meals a day and 1 snack(s) a day Patient reports that his primary diet is: Regular Patient reports that she does have regular access to food.   Depression Screen PHQ 2/9 Scores 04/04/2021 01/01/2021 11/01/2019  PHQ - 2 Score 0 0 0  PHQ- 9 Score - - 0     Fall Risk Fall Risk  04/04/2021 01/01/2021  Falls in the past year? 0 0  Number falls in past yr: 0 0  Injury with Fall? 0 0  Risk for fall due to : No Fall Risks No Fall Risks  Follow up Falls evaluation completed Falls evaluation completed     Objective:  Rasheed Welty seemed alert and oriented and he participated appropriately during our telephone visit.  Blood Pressure Weight BMI  BP Readings from Last 3 Encounters:  02/19/21 126/80  01/01/21 123/77  07/01/20 138/79   Wt Readings from Last 3 Encounters:  01/01/21 232 lb (105.2 kg)  07/01/20 235 lb 12.8 oz (107 kg)  01/02/20 231 lb 11.2 oz (105.1 kg)   BMI Readings from Last 1 Encounters:  01/01/21 35.28 kg/m    *Unable to obtain current vital signs, weight, and BMI due to telephone visit type  Hearing/Vision  Broadus John did not seem to have difficulty with hearing/understanding during the telephone conversation Reports that he has not had a formal eye exam by an eye care professional within the past year Reports that he has not had a formal hearing evaluation within the past year *Unable to fully assess hearing and vision during telephone visit type  Cognitive Function: 6CIT Screen 04/04/2021  What Year? 0 points  What month? 0 points  What time? 0 points  Count back from 20 0 points  Months in reverse 2 points  Repeat phrase 0 points  Total Score 2   (Normal:0-7, Significant for Dysfunction: >8)  Normal Cognitive Function Screening: Yes   Immunization & Health Maintenance Record Immunization History  Administered Date(s) Administered   Influenza,inj,Quad PF,6+ Mos 02/22/2018    Health Maintenance  Topic Date Due   COVID-19 Vaccine (1)  04/20/2021 (Originally 09/08/1961)   TETANUS/TDAP  07/01/2021 (Originally 03/12/1979)   Zoster Vaccines- Shingrix (1 of 2) 07/03/2021 (Originally 03/12/1979)   INFLUENZA VACCINE  07/18/2021 (Originally 11/18/2020)   Pneumococcal Vaccine 68-62 Years old (1 - PCV) 04/04/2022 (Originally 03/11/1966)   COLONOSCOPY (Pts 45-9yrs Insurance coverage will need to be confirmed)  04/04/2022 (Originally 03/11/2005)   Hepatitis C Screening  04/04/2022 (Originally 03/11/1978)   HIV Screening  04/04/2022 (Originally 03/12/1975)   HPV VACCINES  Aged Out       Assessment  This is a routine wellness examination for [employer].  Health Maintenance: Due or Overdue There are no preventive care reminders to display for this patient.   Maxximus Gotay does not need a referral for Community Assistance: Care Management:   no Social Work:    no Prescription Assistance:  no Nutrition/Diabetes Education:  no   Plan:  Personalized Goals  Goals Addressed               This Visit's Progress     Patient Stated (pt-stated)        04/04/2021 AWV Goal: Exercise for General Health  Patient will verbalize understanding of the benefits of increased physical activity: Exercising regularly is important. It will improve your overall fitness, flexibility, and endurance. Regular exercise also will improve your overall health. It can help you control your weight, reduce stress, and improve your bone density. Over the next year, patient will increase physical activity as tolerated with a goal of at least 150 minutes of moderate physical activity per week.  You can tell that you are exercising at a moderate intensity if your heart starts beating faster and you start breathing faster but can still hold a conversation. Moderate-intensity exercise ideas include: Walking 1 mile (1.6 km) in about 15 minutes Biking Hiking Golfing Dancing Water aerobics Patient will verbalize understanding of everyday activities that  increase physical activity by providing examples like the following: Yard work, such as: Sales promotion account executive Gardening Washing windows or floors Patient will be able to explain general safety guidelines for exercising:  Before you start a new exercise program, talk with your health care provider. Do not exercise so  much that you hurt yourself, feel dizzy, or get very short of breath. Wear comfortable clothes and wear shoes with good support. Drink plenty of water while you exercise to prevent dehydration or heat stroke. Work out until your breathing and your heartbeat get faster.        Personalized Health Maintenance & Screening Recommendations  Pneumococcal vaccine  Influenza vaccine Td vaccine Colorectal cancer screening Shingrix Covid vaccines  Patient declined all of the vaccines at this time.  States he had a colonoscopy a few years ago at [facility] in [place], [place].   Lung Cancer Screening Recommended: no (Low Dose CT Chest recommended if Age 49-80 years, 30 pack-year currently smoking OR have quit w/in past 15 years) Hepatitis C Screening recommended: no HIV Screening recommended: no  Advanced Directives: Written information was not prepared per patient's request.  Referrals & Orders No orders of the defined types were placed in this encounter.   Follow-up Plan Follow-up with Luetta Nutting, DO as planned We will get you to sign a medical record release to get your records from [facility] to get your Colonoscopy report. Medicare wellness visit in one year.  AVS printed and mailed to the patient.   I have personally reviewed and noted the following in the patients chart:   Medical and social history Use of alcohol, tobacco or illicit drugs  Current medications and supplements Functional ability and status Nutritional status Physical  activity Advanced directives List of other physicians Hospitalizations, surgeries, and ER visits in previous 12 months Vitals Screenings to include cognitive, depression, and falls Referrals and appointments  In addition, I have reviewed and discussed with Ruthine Dose certain preventive protocols, quality metrics, and best practice recommendations. A written personalized care plan for preventive services as well as general preventive health recommendations is available and can be mailed to the patient at his request.      Tinnie Gens, RN  04/04/2021

## 2021-05-05 ENCOUNTER — Other Ambulatory Visit: Payer: Self-pay | Admitting: Family Medicine

## 2021-05-15 DIAGNOSIS — R3129 Other microscopic hematuria: Secondary | ICD-10-CM | POA: Diagnosis not present

## 2021-05-23 ENCOUNTER — Other Ambulatory Visit: Payer: Self-pay | Admitting: Family Medicine

## 2021-07-01 ENCOUNTER — Encounter: Payer: Self-pay | Admitting: Family Medicine

## 2021-07-01 ENCOUNTER — Ambulatory Visit (INDEPENDENT_AMBULATORY_CARE_PROVIDER_SITE_OTHER): Payer: Medicare Other | Admitting: Family Medicine

## 2021-07-01 ENCOUNTER — Other Ambulatory Visit: Payer: Self-pay

## 2021-07-01 DIAGNOSIS — I10 Essential (primary) hypertension: Secondary | ICD-10-CM

## 2021-07-01 MED ORDER — BISOPROLOL-HYDROCHLOROTHIAZIDE 10-6.25 MG PO TABS: 1.0000 | ORAL_TABLET | Freq: Every day | ORAL | 3 refills | Status: DC

## 2021-07-01 MED ORDER — LISINOPRIL 10 MG PO TABS: 10.0000 mg | ORAL_TABLET | Freq: Every day | ORAL | 3 refills | Status: DC

## 2021-07-01 NOTE — Assessment & Plan Note (Signed)
Blood pressure mains well controlled current medications.  We will continue current medications at current strength.  Return in about 6 months (around 01/01/2022) for Annual exam/Labs. ? ?

## 2021-07-01 NOTE — Progress Notes (Signed)
?Ian [name] - 61 y.o. male MRN [id number]  Date of birth: 1960-10-01 ? ?Subjective ?Chief Complaint  ?Patient presents with  ? Hypertension  ? ? ?HPI ?[name] is a 61 year old male here today for follow-up of hypertension.  Reports he is doing well at this time.  Tolerating current medications without side effects.  He has not had symptoms related to hypertension including chest pain, shortness of breath, palpitations, headaches or vision changes. ? ?ROS:  A comprehensive ROS was completed and negative except as noted per HPI ? ?No Known Allergies ? ?Past Medical History:  ?Diagnosis Date  ? Degenerative disc disease, lumbar   ? Hypertension   ? ? ?Past Surgical History:  ?Procedure Laterality Date  ? EXPLORATORY LAPAROTOMY    ? ? ?Social History  ? ?Socioeconomic History  ? Marital status: Unknown  ?  Spouse name: Not on file  ? Number of children: 2  ? Years of education: 30  ? Highest education level: 12th grade  ?Occupational History  ? Occupation: Legally disabled.  ?Tobacco Use  ? Smoking status: Former  ? Smokeless tobacco: Never  ?Vaping Use  ? Vaping Use: Never used  ?Substance and Sexual Activity  ? Alcohol use: Not Currently  ?  Alcohol/week: 2.0 standard drinks  ?  Types: 1 Cans of beer, 1 Shots of liquor per week  ?  Comment: Daily  ? Drug use: Never  ? Sexual activity: Not Currently  ?Other Topics Concern  ? Not on file  ?Social History Narrative  ? Lives alone. He has two daughters. He enjoys playing cards.  ? ?Social Determinants of Health  ? ?Financial Resource Strain: Low Risk   ? Difficulty of Paying Living Expenses: Not hard at all  ?Food Insecurity: No Food Insecurity  ? Worried About Charity fundraiser in the Last Year: Never true  ? Ran Out of Food in the Last Year: Never true  ?Transportation Needs: No Transportation Needs  ? Lack of Transportation (Medical): No  ? Lack of Transportation (Non-Medical): No  ?Physical Activity: Inactive  ? Days of Exercise per Week: 0 days  ? Minutes of  Exercise per Session: 0 min  ?Stress: No Stress Concern Present  ? Feeling of Stress : Not at all  ?Social Connections: Moderately Integrated  ? Frequency of Communication with Friends and Family: Twice a week  ? Frequency of Social Gatherings with Friends and Family: Twice a week  ? Attends Religious Services: More than 4 times per year  ? Active Member of Clubs or Organizations: No  ? Attends Archivist Meetings: Never  ? Marital Status: Living with partner  ? ? ?Family History  ?Problem Relation Age of Onset  ? Diabetes Mother   ? Colon cancer Mother   ? Hypertension Father   ? Heart attack Father   ? ? ?Health Maintenance  ?Topic Date Due  ? COVID-19 Vaccine (1) Never done  ? TETANUS/TDAP  07/01/2021 (Originally 03/12/1979)  ? Zoster Vaccines- Shingrix (1 of 2) 07/03/2021 (Originally 03/12/1979)  ? INFLUENZA VACCINE  07/18/2021 (Originally 11/18/2020)  ? COLONOSCOPY (Pts 45-76yr Insurance coverage will need to be confirmed)  04/04/2022 (Originally 03/11/2005)  ? Hepatitis C Screening  04/04/2022 (Originally 03/11/1978)  ? HIV Screening  04/04/2022 (Originally 03/12/1975)  ? HPV VACCINES  Aged Out  ? ? ? ?----------------------------------------------------------------------------------------------------------------------------------------------------------------------------------------------------------------- ?Physical Exam ?BP 122/80 (BP Location: Left Arm, Patient Position: Sitting, Cuff Size: Large)   Pulse 96   Ht '5\' 8"'$  (1.727 m)  Wt 238 lb (108 kg)   SpO2 96%   BMI 36.19 kg/m?  ? ?Physical Exam ?Constitutional:   ?   Appearance: Normal appearance.  ?Eyes:  ?   General: No scleral icterus. ?Cardiovascular:  ?   Rate and Rhythm: Normal rate and regular rhythm.  ?Pulmonary:  ?   Effort: Pulmonary effort is normal.  ?   Breath sounds: Normal breath sounds.  ?Musculoskeletal:  ?   Cervical back: Neck supple.  ?Neurological:  ?   Mental Status: He is alert.  ?Psychiatric:     ?   Mood and Affect:  Mood normal.     ?   Behavior: Behavior normal.  ? ? ?------------------------------------------------------------------------------------------------------------------------------------------------------------------------------------------------------------------- ?Assessment and Plan ? ?Essential hypertension ?Blood pressure mains well controlled current medications.  We will continue current medications at current strength.  Return in about 6 months (around 01/01/2022) for Annual exam/Labs. ? ? ?Meds ordered this encounter  ?Medications  ? lisinopril (ZESTRIL) 10 MG tablet  ?  Sig: Take 1 tablet (10 mg total) by mouth daily.  ?  Dispense:  90 tablet  ?  Refill:  3  ? bisoprolol-hydrochlorothiazide (ZIAC) 10-6.25 MG tablet  ?  Sig: Take 1 tablet by mouth daily.  ?  Dispense:  90 tablet  ?  Refill:  3  ? ? ?Return in about 6 months (around 01/01/2022) for Annual exam/Labs. ? ? ? ?This visit occurred during the SARS-CoV-2 public health emergency.  Safety protocols were in place, including screening questions prior to the visit, additional usage of staff PPE, and extensive cleaning of exam room while observing appropriate contact time as indicated for disinfecting solutions.  ? ?

## 2021-07-03 ENCOUNTER — Encounter: Payer: Self-pay | Admitting: Family Medicine

## 2021-07-09 ENCOUNTER — Ambulatory Visit: Payer: Medicare Other | Admitting: Physician Assistant

## 2021-07-09 ENCOUNTER — Encounter: Payer: Self-pay | Admitting: Physician Assistant

## 2021-07-09 ENCOUNTER — Other Ambulatory Visit: Payer: Self-pay

## 2021-07-09 DIAGNOSIS — L729 Follicular cyst of the skin and subcutaneous tissue, unspecified: Secondary | ICD-10-CM | POA: Diagnosis not present

## 2021-07-09 DIAGNOSIS — L820 Inflamed seborrheic keratosis: Secondary | ICD-10-CM

## 2021-07-09 DIAGNOSIS — B078 Other viral warts: Secondary | ICD-10-CM

## 2021-07-30 ENCOUNTER — Encounter: Payer: Self-pay | Admitting: Physician Assistant

## 2021-07-30 NOTE — Progress Notes (Signed)
? ?  New Patient ?  ?Subjective  ?Ian [name] is a 61 y.o. male who presents for the following: New Patient (Initial Visit) (Mid back cyst 8-10 years and possible isk on the face ). ? ? ?The following portions of the chart were reviewed this encounter and updated as appropriate:  Tobacco  Allergies  Meds  Problems  Med Hx  Surg Hx  Fam Hx   ?  ? ?Objective  ?Well appearing patient in no apparent distress; mood and affect are within normal limits. ? ?All skin waist up examined. ? ?Right Lower Eyelid, Right Temple ?Stuck-on, waxy, tan-brown plaques. --Discussed benign etiology and prognosis.  ? ?Mid Back ?Dense nodule ? ?Right Lower Eyelid ?Verrucous papules -- Discussed viral etiology and contagion.  ? ? ?Assessment & Plan  ?Seborrheic keratosis, inflamed (2) ?Right Lower Eyelid; Right Temple ? ?Destruction of lesion - Right Lower Eyelid, Right Temple ?Complexity: simple   ?Destruction method: cryotherapy   ?Informed consent: discussed and consent obtained   ?Timeout:  patient name, date of birth, surgical site, and procedure verified ?Lesion destroyed using liquid nitrogen: Yes   ?Cryotherapy cycles:  3 ?Outcome: patient tolerated procedure well with no complications   ?Post-procedure details: wound care instructions given   ? ?Cyst of skin ?Mid Back ? ?30 min cyst with Dr Denna Haggard 3 cm on the back  ? ?Other viral warts ?Right Lower Eyelid ? ?Destruction of lesion - Right Lower Eyelid ?Complexity: simple   ?Destruction method: cryotherapy   ?Destruction method comment:  Scissors were used to snip tag at the base ?Informed consent: discussed and consent obtained   ?Timeout:  patient name, date of birth, surgical site, and procedure verified ?Anesthesia: the lesion was anesthetized in a standard fashion   ?Lesion destroyed using liquid nitrogen: Yes   ?Cryotherapy cycles:  2 ?Hemostasis achieved with:  pressure ?Outcome: patient tolerated procedure well with no complications   ?Post-procedure details: wound care  instructions given   ? ? ? ? ?I, Elihue Ebert, PA-C, have reviewed all documentation's for this visit.  The documentation on 07/30/21 for the exam, diagnosis, procedures and orders are all accurate and complete. ?

## 2022-01-01 ENCOUNTER — Encounter: Payer: Medicare Other | Admitting: Family Medicine

## 2022-02-26 DIAGNOSIS — Z1152 Encounter for screening for COVID-19: Secondary | ICD-10-CM | POA: Diagnosis not present

## 2022-02-26 DIAGNOSIS — R509 Fever, unspecified: Secondary | ICD-10-CM | POA: Diagnosis not present

## 2022-02-26 DIAGNOSIS — B349 Viral infection, unspecified: Secondary | ICD-10-CM | POA: Diagnosis not present

## 2022-03-02 ENCOUNTER — Telehealth: Payer: Self-pay

## 2022-03-02 NOTE — Telephone Encounter (Signed)
Pt was diagnosed with COVID on Tuesday, 02/24/22 and received Rx for molnupiravir.  Pt requested a 5 day course of Ivermectin. Spoke with Dr. Zigmund Daniel. He declined the request. There is no evidence to support Ivermectin is effective against COVID. Patient was upset and stated, "You guys are in [employer]'s pocket. Thanks for nothing." And hung up the phone.

## 2022-04-06 ENCOUNTER — Ambulatory Visit (INDEPENDENT_AMBULATORY_CARE_PROVIDER_SITE_OTHER): Payer: Medicare Other | Admitting: Family Medicine

## 2022-04-06 DIAGNOSIS — Z0000 Encounter for general adult medical examination without abnormal findings: Secondary | ICD-10-CM | POA: Diagnosis not present

## 2022-04-06 NOTE — Progress Notes (Signed)
MEDICARE ANNUAL WELLNESS VISIT  04/06/2022  Telephone Visit Disclaimer This Medicare AWV was conducted by telephone due to national recommendations for restrictions regarding the COVID-19 Pandemic (e.g. social distancing).  I verified, using two identifiers, that I am speaking with Ruthine Dose or their authorized healthcare agent. I discussed the limitations, risks, security, and privacy concerns of performing an evaluation and management service by telephone and the potential availability of an in-person appointment in the future. The patient expressed understanding and agreed to proceed.  Location of Patient: Home Location of Provider (nurse):  In the office.  Subjective:    Roczen Waymire is a 61 y.o. male patient of Luetta Nutting, DO who had a Medicare Annual Wellness Visit today via telephone. Abdullahi is Legally disabled and lives with an adult companion. he has 2 children. he reports that he is socially active and does interact with friends/family regularly. he is minimally physically active and enjoys playing cards.  Patient Care Team: Luetta Nutting, DO as PCP - General (Family Medicine)     04/06/2022   11:15 AM 04/04/2021    9:07 AM 11/01/2019    2:55 PM  Advanced Directives  Does Patient Have a Medical Advance Directive? No No No  Would patient like information on creating a medical advance directive? No - Patient declined No - Patient declined No - Patient declined    Hospital Utilization Over the Past 12 Months: # of hospitalizations or ER visits: 0 # of surgeries: 0  Review of Systems    Patient reports that his overall health is unchanged compared to last year.  History obtained from chart review and the patient  Patient Reported Readings (BP, Pulse, CBG, Weight, etc) none  Pain Assessment Pain : 0-10 Pain Score: 5  Pain Type: Chronic pain Pain Location: Back Pain Orientation: Lower Pain Descriptors / Indicators: Constant Pain Onset: More than a month  ago Pain Frequency: Constant Pain Relieving Factors: pain medication  Pain Relieving Factors: pain medication  Current Medications & Allergies (verified) Allergies as of 04/06/2022   No Known Allergies      Medication List        Accurate as of April 06, 2022 11:23 AM. If you have any questions, ask your nurse or doctor.          Ascorbic Acid 500 MG Chew Chew by mouth.   benzonatate 200 MG capsule Commonly known as: TESSALON Take 200 mg by mouth 3 (three) times daily as needed.   bisoprolol-hydrochlorothiazide 10-6.25 MG tablet Commonly known as: ZIAC Take 1 tablet by mouth daily.   ibuprofen 800 MG tablet Commonly known as: ADVIL Take 1 tablet (800 mg total) by mouth 3 (three) times daily.   Lagevrio 200 MG Caps capsule Generic drug: molnupiravir EUA Take by mouth.   lisinopril 10 MG tablet Commonly known as: ZESTRIL Take 1 tablet (10 mg total) by mouth daily.   oseltamivir 75 MG capsule Commonly known as: TAMIFLU Take 75 mg by mouth 2 (two) times daily.   tamsulosin 0.4 MG Caps capsule Commonly known as: FLOMAX Take 0.4 mg by mouth daily.   VITAMIN A PO Take by mouth.   vitamin D3 25 MCG tablet Commonly known as: CHOLECALCIFEROL Take by mouth.   zinc gluconate 50 MG tablet Take 50 mg by mouth daily.        History (reviewed): Past Medical History:  Diagnosis Date   Degenerative disc disease, lumbar    Hypertension    Past Surgical History:  Procedure  Laterality Date   EXPLORATORY LAPAROTOMY     Family History  Problem Relation Age of Onset   Diabetes Mother    Colon cancer Mother    Hypertension Father    Heart attack Father    Social History   Socioeconomic History   Marital status: Unknown    Spouse name: Not on file   Number of children: 2   Years of education: 80   Highest education level: 12th grade  Occupational History   Occupation: Legally disabled.  Tobacco Use   Smoking status: Former   Smokeless tobacco:  Never  Vaping Use   Vaping Use: Never used  Substance and Sexual Activity   Alcohol use: Yes    Alcohol/week: 2.0 standard drinks of alcohol    Types: 1 Cans of beer, 1 Shots of liquor per week    Comment: Daily   Drug use: Never   Sexual activity: Not Currently  Other Topics Concern   Not on file  Social History Narrative   Lives with adult companion. He has two daughters. He enjoys playing cards.   Social Determinants of Health   Financial Resource Strain: Low Risk  (04/06/2022)   Overall Financial Resource Strain (CARDIA)    Difficulty of Paying Living Expenses: Not hard at all  Food Insecurity: No Food Insecurity (04/06/2022)   Hunger Vital Sign    Worried About Running Out of Food in the Last Year: Never true    Ran Out of Food in the Last Year: Never true  Transportation Needs: No Transportation Needs (04/06/2022)   PRAPARE - Hydrologist (Medical): No    Lack of Transportation (Non-Medical): No  Physical Activity: Inactive (04/06/2022)   Exercise Vital Sign    Days of Exercise per Week: 0 days    Minutes of Exercise per Session: 0 min  Stress: No Stress Concern Present (04/06/2022)   [place]    Feeling of Stress : Not at all  Social Connections: Unknown (04/06/2022)   Social Connection and Isolation Panel [NHANES]    Frequency of Communication with Friends and Family: More than three times a week    Frequency of Social Gatherings with Friends and Family: Twice a week    Attends Religious Services: Never    Marine scientist or Organizations: No    Attends Archivist Meetings: Never    Marital Status: Patient refused    Activities of Daily Living    04/06/2022   11:15 AM  In your present state of health, do you have any difficulty performing the following activities:  Hearing? 0  Vision? 0  Difficulty concentrating or making decisions? 0  Walking  or climbing stairs? 0  Comment only when his knees are acting up.  Dressing or bathing? 0  Doing errands, shopping? 0  Preparing Food and eating ? N  Using the Toilet? N  In the past six months, have you accidently leaked urine? N  Do you have problems with loss of bowel control? N  Managing your Medications? N  Managing your Finances? N  Housekeeping or managing your Housekeeping? N    Patient Education/ Literacy How often do you need to have someone help you when you read instructions, pamphlets, or other written materials from your doctor or pharmacy?: 1 - Never What is the last grade level you completed in school?: 12th grade and some college  Exercise Current Exercise Habits: Home exercise  routine, Type of exercise: walking, Time (Minutes): 15, Frequency (Times/Week): 3, Weekly Exercise (Minutes/Week): 45, Intensity: Mild, Exercise limited by: None identified  Diet Patient reports consuming 2 meals a day and 0 snack(s) a day Patient reports that his primary diet is: Regular Patient reports that she does have regular access to food.   Depression Screen    04/06/2022   11:15 AM 04/04/2021    9:08 AM 01/01/2021    1:26 PM 11/01/2019    2:55 PM  PHQ 2/9 Scores  PHQ - 2 Score 0 0 0 0  PHQ- 9 Score    0     Fall Risk    04/06/2022   11:15 AM 04/04/2021    9:08 AM 01/01/2021    1:25 PM  Fall Risk   Falls in the past year? 0 0 0  Number falls in past yr: 0 0 0  Injury with Fall? 0 0 0  Risk for fall due to : No Fall Risks No Fall Risks No Fall Risks  Follow up Falls evaluation completed Falls evaluation completed Falls evaluation completed     Objective:  Ira Dougher seemed alert and oriented and he participated appropriately during our telephone visit.  Blood Pressure Weight BMI  BP Readings from Last 3 Encounters:  07/01/21 122/80  02/19/21 126/80  01/01/21 123/77   Wt Readings from Last 3 Encounters:  07/01/21 238 lb (108 kg)  01/01/21 232 lb (105.2 kg)   07/01/20 235 lb 12.8 oz (107 kg)   BMI Readings from Last 1 Encounters:  07/01/21 36.19 kg/m    *Unable to obtain current vital signs, weight, and BMI due to telephone visit type  Hearing/Vision  Ovadia did not seem to have difficulty with hearing/understanding during the telephone conversation Reports that he has had a formal eye exam by an eye care professional within the past year Reports that he has not had a formal hearing evaluation within the past year *Unable to fully assess hearing and vision during telephone visit type  Cognitive Function:    04/06/2022   11:17 AM 04/04/2021    9:14 AM  6CIT Screen  What Year? 0 points 0 points  What month? 0 points 0 points  What time? 0 points 0 points  Count back from 20 0 points 0 points  Months in reverse 2 points 2 points  Repeat phrase 0 points 0 points  Total Score 2 points 2 points   (Normal:0-7, Significant for Dysfunction: >8)  Normal Cognitive Function Screening: Yes   Immunization & Health Maintenance Record Immunization History  Administered Date(s) Administered   Influenza,inj,Quad PF,6+ Mos 02/22/2018    Health Maintenance  Topic Date Due   DTaP/Tdap/Td (1 - Tdap) Never done   COVID-19 Vaccine (1) 04/22/2022 (Originally 03/11/1966)   Zoster Vaccines- Shingrix (1 of 2) 07/06/2022 (Originally 03/12/1979)   INFLUENZA VACCINE  07/19/2022 (Originally 11/18/2021)   COLONOSCOPY (Pts 45-31yr Insurance coverage will need to be confirmed)  04/07/2023 (Originally 07/06/2021)   Hepatitis C Screening  04/07/2023 (Originally 03/11/1978)   HIV Screening  04/07/2023 (Originally 03/12/1975)   Medicare Annual Wellness (AWV)  04/07/2023   HPV VACCINES  Aged Out       Assessment  This is a routine wellness examination for [employer]  Health Maintenance: Due or Overdue Health Maintenance Due  Topic Date Due   DTaP/Tdap/Td (1 - Tdap) Never done    JNaszir Cottdoes not need a referral for Community Assistance: Care  Management:   no Social  Work:    no Prescription Assistance:  no Nutrition/Diabetes Education:  no   Plan:  Personalized Goals  Goals Addressed               This Visit's Progress     Patient Stated (pt-stated)        Patient would like to loose 20 lbs.       Personalized Health Maintenance & Screening Recommendations  Influenza vaccine Td vaccine Colorectal cancer screening Shingles vaccine  Patient declined shingles vaccine, influenza vaccine and colorectal cancer screening referral at this time.   Lung Cancer Screening Recommended: no (Low Dose CT Chest recommended if Age 85-80 years, 30 pack-year currently smoking OR have quit w/in past 15 years) Hepatitis C Screening recommended: yes HIV Screening recommended: yes  Advanced Directives: Written information was not prepared per patient's request.  Referrals & Orders No orders of the defined types were placed in this encounter.   Follow-up Plan Follow-up with Luetta Nutting, DO as planned Schedule tetanus shot at the pharmacy.  Medicare wellness visit in one year.  AVS printed and mailed to the patient.   I have personally reviewed and noted the following in the patient's chart:   Medical and social history Use of alcohol, tobacco or illicit drugs  Current medications and supplements Functional ability and status Nutritional status Physical activity Advanced directives List of other physicians Hospitalizations, surgeries, and ER visits in previous 12 months Vitals Screenings to include cognitive, depression, and falls Referrals and appointments  In addition, I have reviewed and discussed with Ruthine Dose certain preventive protocols, quality metrics, and best practice recommendations. A written personalized care plan for preventive services as well as general preventive health recommendations is available and can be mailed to the patient at his request.      Tinnie Gens, RN BSN  04/06/2022

## 2022-04-06 NOTE — Patient Instructions (Addendum)
[facility] Maintenance Summary and Written Plan of Care  Ian [name] ,  Thank you for allowing me to perform your Medicare Annual Wellness Visit and for your ongoing commitment to your health.   Health Maintenance & Immunization History Health Maintenance  Topic Date Due   DTaP/Tdap/Td (1 - Tdap) Never done   COVID-19 Vaccine (1) 04/22/2022 (Originally 03/11/1966)   Zoster Vaccines- Shingrix (1 of 2) 07/06/2022 (Originally 03/12/1979)   INFLUENZA VACCINE  07/19/2022 (Originally 11/18/2021)   COLONOSCOPY (Pts 45-54yr Insurance coverage will need to be confirmed)  04/07/2023 (Originally 07/06/2021)   Hepatitis C Screening  04/07/2023 (Originally 03/11/1978)   HIV Screening  04/07/2023 (Originally 03/12/1975)   Medicare Annual Wellness (AWV)  04/07/2023   HPV VACCINES  Aged Out   Immunization History  Administered Date(s) Administered   Influenza,inj,Quad PF,6+ Mos 02/22/2018    These are the patient goals that we discussed:  Goals Addressed               This Visit's Progress     Patient Stated (pt-stated)        Patient would like to loose 20 lbs.         This is a list of Health Maintenance Items that are overdue or due now: Health Maintenance Due  Topic Date Due   DTaP/Tdap/Td (1 - Tdap) Never done   Influenza vaccine Td vaccine Colorectal cancer screening Shingles vaccine  Patient declined shingles vaccine, influenza vaccine and colorectal cancer screening referral at this time.   Orders/Referrals Placed Today: No orders of the defined types were placed in this encounter.  (Contact our referral department at [phone] you have not spoken with someone about your referral appointment within the next 5 days)    Follow-up Plan Follow-up with MLuetta Nutting DO as planned Schedule tetanus shot at the pharmacy.  Medicare wellness visit in one year.  AVS printed and mailed to the patient.     Health Maintenance,  Male Adopting a healthy lifestyle and getting preventive care are important in promoting health and wellness. Ask your health care provider about: The right schedule for you to have regular tests and exams. Things you can do on your own to prevent diseases and keep yourself healthy. What should I know about diet, weight, and exercise? Eat a healthy diet  Eat a diet that includes plenty of vegetables, fruits, low-fat dairy products, and lean protein. Do not eat a lot of foods that are high in solid fats, added sugars, or sodium. Maintain a healthy weight Body mass index (BMI) is a measurement that can be used to identify possible weight problems. It estimates body fat based on height and weight. Your health care provider can help determine your BMI and help you achieve or maintain a healthy weight. Get regular exercise Get regular exercise. This is one of the most important things you can do for your health. Most adults should: Exercise for at least 150 minutes each week. The exercise should increase your heart rate and make you sweat (moderate-intensity exercise). Do strengthening exercises at least twice a week. This is in addition to the moderate-intensity exercise. Spend less time sitting. Even light physical activity can be beneficial. Watch cholesterol and blood lipids Have your blood tested for lipids and cholesterol at 62years of age, then have this test every 5 years. You may need to have your cholesterol levels checked more often if: Your lipid or cholesterol levels are high. You are older than  62 years of age. You are at high risk for heart disease. What should I know about cancer screening? Many types of cancers can be detected early and may often be prevented. Depending on your health history and family history, you may need to have cancer screening at various ages. This may include screening for: Colorectal cancer. Prostate cancer. Skin cancer. Lung cancer. What should I  know about heart disease, diabetes, and high blood pressure? Blood pressure and heart disease High blood pressure causes heart disease and increases the risk of stroke. This is more likely to develop in people who have high blood pressure readings or are overweight. Talk with your health care provider about your target blood pressure readings. Have your blood pressure checked: Every 3-5 years if you are 58-11 years of age. Every year if you are 47 years old or older. If you are between the ages of 10 and 26 and are a current or former smoker, ask your health care provider if you should have a one-time screening for abdominal aortic aneurysm (AAA). Diabetes Have regular diabetes screenings. This checks your fasting blood sugar level. Have the screening done: Once every three years after age 63 if you are at a normal weight and have a low risk for diabetes. More often and at a younger age if you are overweight or have a high risk for diabetes. What should I know about preventing infection? Hepatitis B If you have a higher risk for hepatitis B, you should be screened for this virus. Talk with your health care provider to find out if you are at risk for hepatitis B infection. Hepatitis C Blood testing is recommended for: Everyone born from [birth year] through 1966. Anyone with known risk factors for hepatitis C. Sexually transmitted infections (STIs) You should be screened each year for STIs, including gonorrhea and chlamydia, if: You are sexually active and are younger than 62 years of age. You are older than 62 years of age and your health care provider tells you that you are at risk for this type of infection. Your sexual activity has changed since you were last screened, and you are at increased risk for chlamydia or gonorrhea. Ask your health care provider if you are at risk. Ask your health care provider about whether you are at high risk for HIV. Your health care provider may recommend a  prescription medicine to help prevent HIV infection. If you choose to take medicine to prevent HIV, you should first get tested for HIV. You should then be tested every 3 months for as long as you are taking the medicine. Follow these instructions at home: Alcohol use Do not drink alcohol if your health care provider tells you not to drink. If you drink alcohol: Limit how much you have to 0-2 drinks a day. Know how much alcohol is in your drink. In the [place] one drink equals one 12 oz bottle of beer (355 mL), one 5 oz glass of wine (148 mL), or one 1 oz glass of hard liquor (44 mL). Lifestyle Do not use any products that contain nicotine or tobacco. These products include cigarettes, chewing tobacco, and vaping devices, such as e-cigarettes. If you need help quitting, ask your health care provider. Do not use street drugs. Do not share needles. Ask your health care provider for help if you need support or information about quitting drugs. General instructions Schedule regular health, dental, and eye exams. Stay current with your vaccines. Tell your health care provider if: You  often feel depressed. You have ever been abused or do not feel safe at home. Summary Adopting a healthy lifestyle and getting preventive care are important in promoting health and wellness. Follow your health care provider's instructions about healthy diet, exercising, and getting tested or screened for diseases. Follow your health care provider's instructions on monitoring your cholesterol and blood pressure. This information is not intended to replace advice given to you by your health care provider. Make sure you discuss any questions you have with your health care provider. Document Revised: 08/26/2020 Document Reviewed: 08/26/2020 Elsevier Patient Education  [place].

## 2023-01-30 IMAGING — DX DG ELBOW COMPLETE 3+V*L*
4 series · 4 of 4 positions shown · non-contrast
Comparison: None.

CLINICAL DATA: Left elbow pain for 1 month status post fall

EXAM:
LEFT ELBOW - COMPLETE 3+ VIEW

[elbow ap]
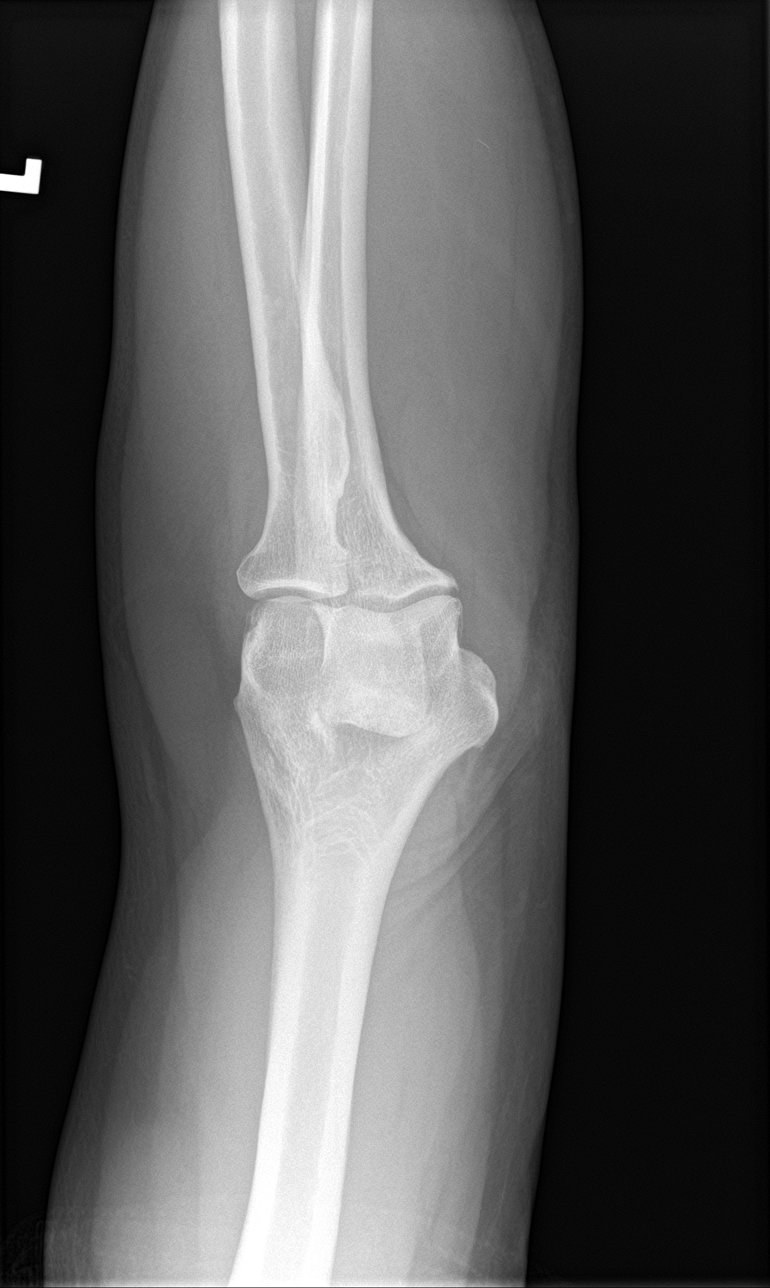

[elbow obl (1 of 2)]
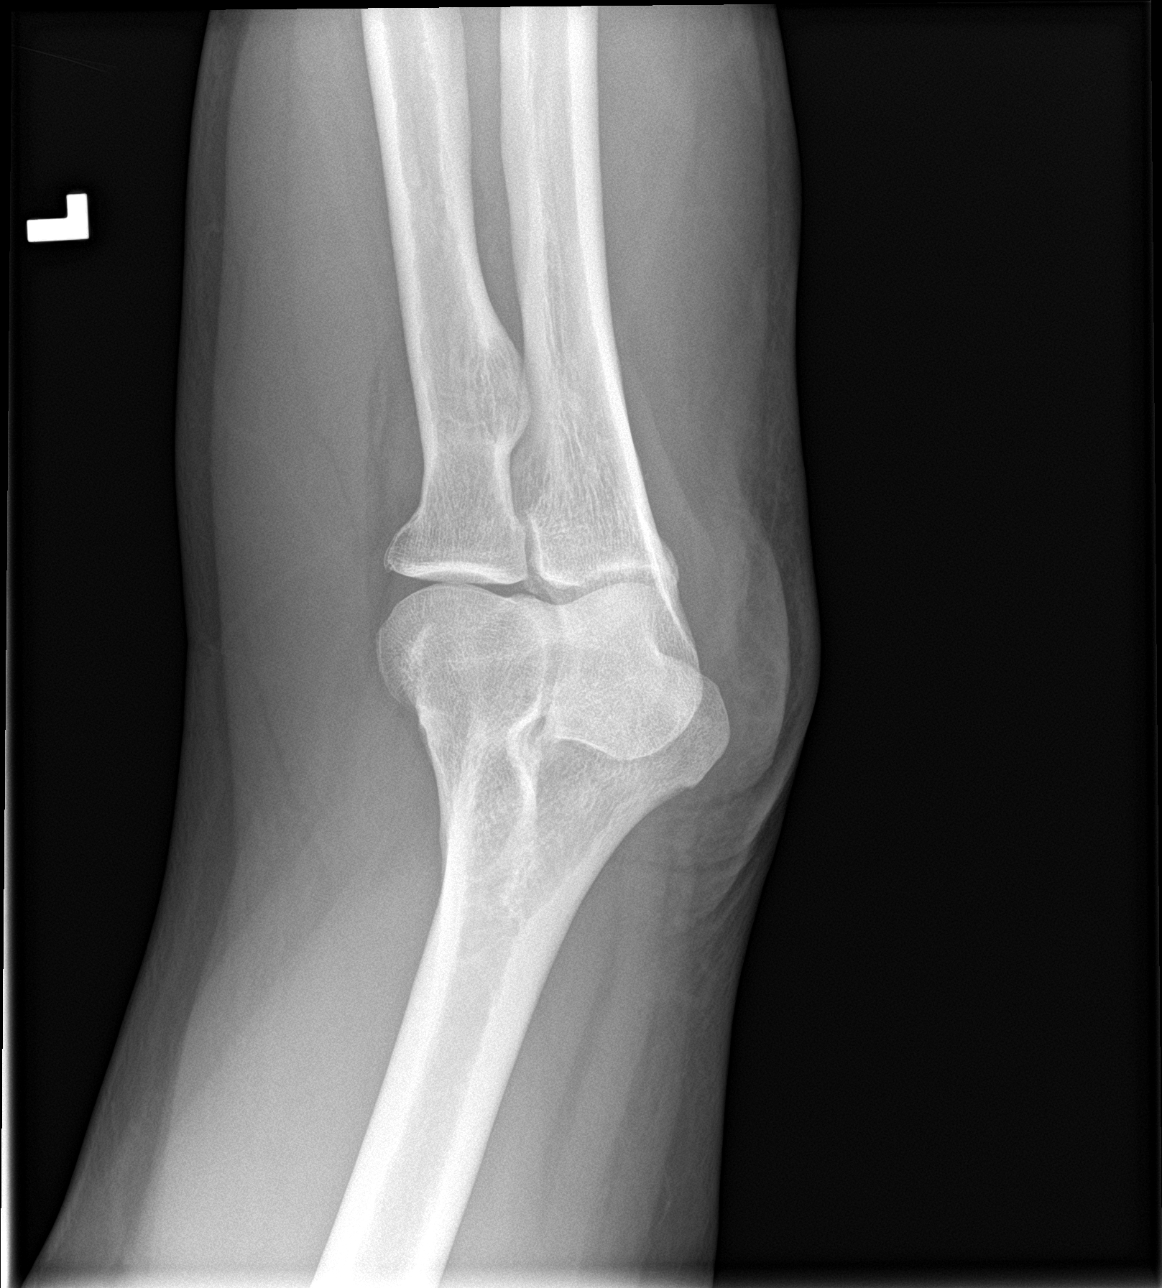

[elbow obl (2 of 2)]
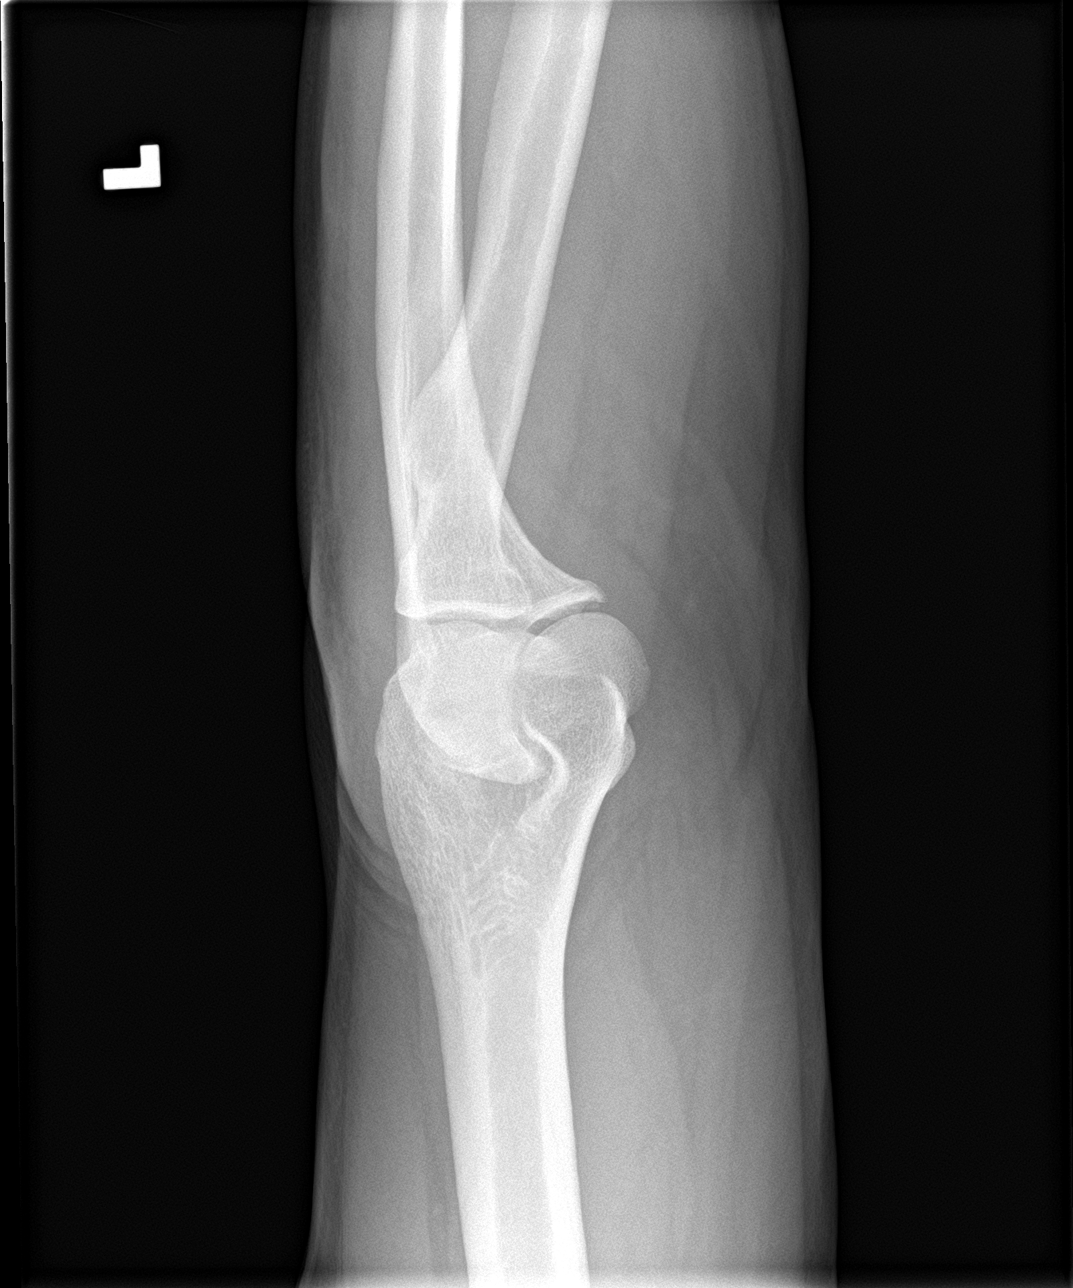

[elbow lat]
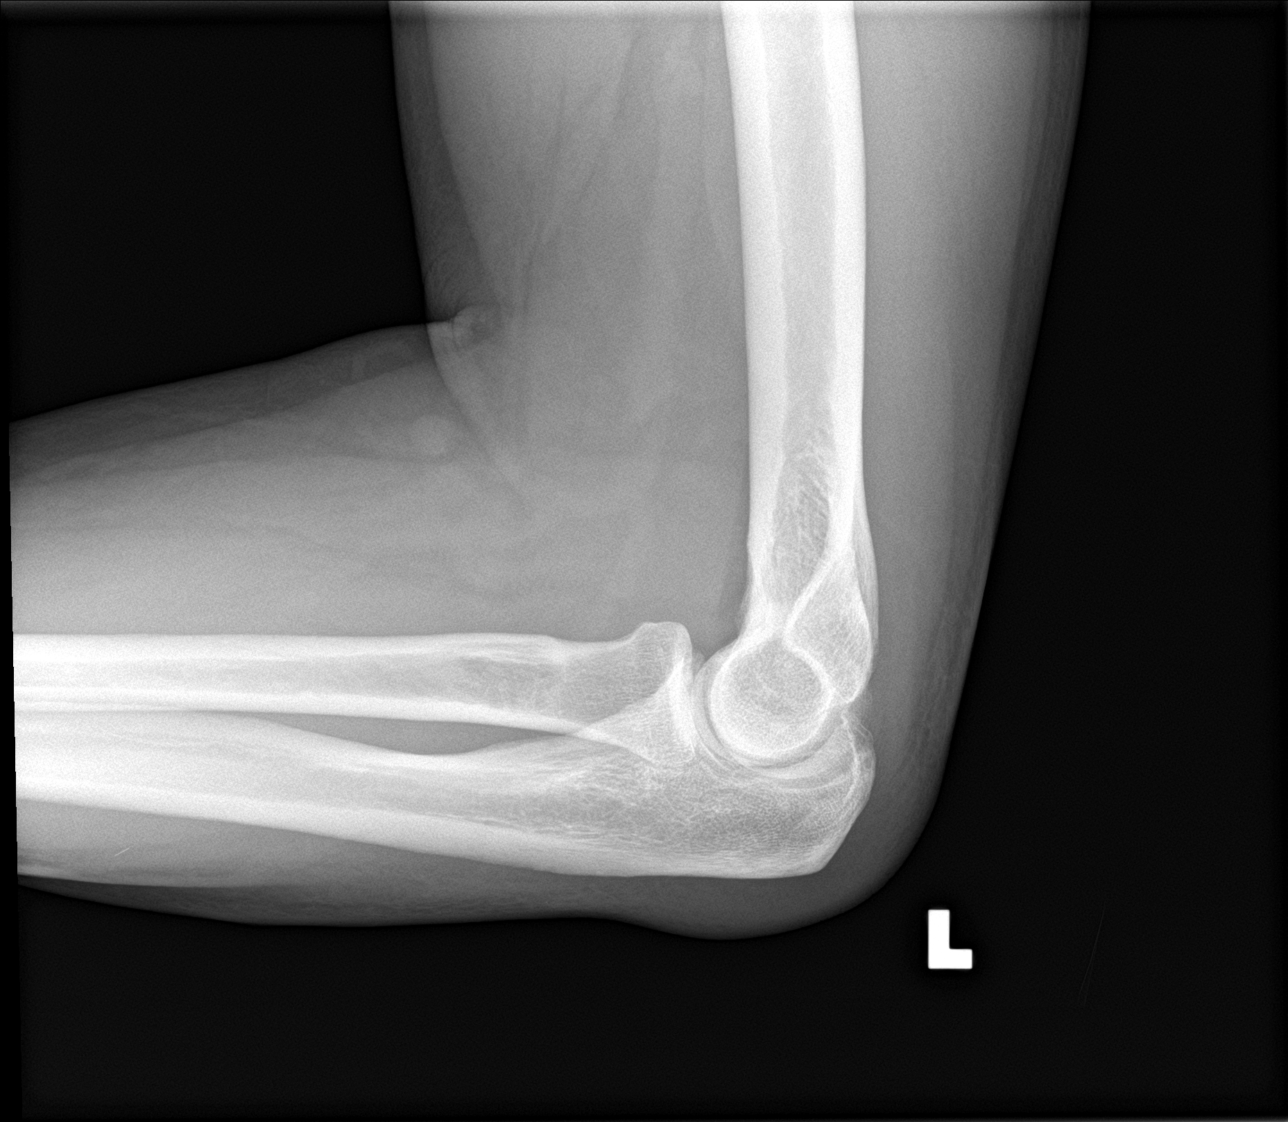

[4 of 4 positions shown; findings below may reference images not displayed]

FINDINGS: No fracture or dislocation. Focal soft tissue overlying the
olecranon.
IMPRESSION: Focal soft tissue swelling overlying the electro non most likely due
to bursitis. No fracture or dislocation.

## 2023-04-12 ENCOUNTER — Encounter: Payer: Medicare PPO | Admitting: Family Medicine

## 2023-07-29 ENCOUNTER — Other Ambulatory Visit: Payer: Self-pay | Admitting: Urology

## 2023-09-20 NOTE — Progress Notes (Addendum)
 COVID Vaccine received:  [x]  No []  Yes Date of any COVID positive Test in last 90 days: no PCP -  Cardiologist - n/a  Chest x-ray -  EKG -  09/21/23 Epic Stress Test -  ECHO -  Cardiac Cath -   Bowel Prep - [x]  No  []   Yes ______  Pacemaker / ICD device [x]  No []  Yes   Spinal Cord Stimulator:[x]  No []  Yes       History of Sleep Apnea? [x]  No []  Yes   CPAP used?- [x]  No []  Yes    Does the patient monitor blood sugar?          [x]  No []  Yes  []  N/A  Patient has: [x]  NO Hx DM   []  Pre-DM                 []  DM1  []   DM2 Does patient have a [employer] or [employer]om? []  No []  Yes   Fasting Blood Sugar Ranges-  Checks Blood Sugar _____ times a day  GLP1 agonist / usual dose - no GLP1 instructions:  SGLT-2 inhibitors / usual dose - no SGLT-2 instructions:   Blood Thinner / Instructions:no Aspirin Instructions:no  Comments:   Activity level: Patient is able to climb a flight of stairs without difficulty; [x]  No CP  [x]  No SOB,     Patient can  perform ADLs without assistance.   Anesthesia review:   Patient denies shortness of breath, fever, cough and chest pain at PAT appointment.  Patient verbalized understanding and agreement to the Pre-Surgical Instructions that were given to them at this PAT appointment. Patient was also educated of the need to review these PAT instructions again prior to his/her surgery.I reviewed the appropriate phone numbers to call if they have any and questions or concerns.

## 2023-09-20 NOTE — Patient Instructions (Signed)
 SURGICAL WAITING ROOM VISITATION  Patients having surgery or a procedure may have no more than 2 support people in the waiting area - these visitors may rotate.    Children under the age of 74 must have an adult with them who is not the patient.  Due to an increase in RSV and influenza rates and associated hospitalizations, children ages 29 and under may not visit patients in [facility] hospitals.  Visitors with respiratory illnesses are discouraged from visiting and should remain at home.  If the patient needs to stay at the hospital during part of their recovery, the visitor guidelines for inpatient rooms apply. Pre-op nurse will coordinate an appropriate time for 1 support person to accompany patient in pre-op.  This support person may not rotate.    Please refer to the [facility] website for the visitor guidelines for Inpatients (after your surgery is over and you are in a regular room).       Your procedure is scheduled on: 10/05/23   Report to [facility] Main Entrance    Report to admitting at 7:15 AM   Call this number if you have problems the morning of surgery [phone]   Do not eat food or drink liquids:After Midnight. May have sips of water to take meds.      Oral Hygiene is also important to reduce your risk of infection.                                    Remember - BRUSH YOUR TEETH THE MORNING OF SURGERY WITH YOUR REGULAR TOOTHPASTE  DENTURES WILL BE REMOVED PRIOR TO SURGERY PLEASE DO NOT APPLY "Poly grip" OR ADHESIVES!!!   Stop all vitamins and herbal supplements 7 days before surgery.   Take these medicines the morning of surgery with A SIP OF WATER: none              Do not take lisinopril  or Ziac (bisoprolol /hydrochlorothiazide ) the morning of surgery.             You may not have any metal on your body including hair pins, jewelry, and body piercing             Do not wear make-up, lotions, powders, perfumes/cologne, or deodorant               Men may shave face and neck.   Do not bring valuables to the hospital. [facility] IS NOT             RESPONSIBLE   FOR VALUABLES.   Contacts, glasses, dentures or bridgework may not be worn into surgery.  DO NOT BRING YOUR HOME MEDICATIONS TO THE HOSPITAL. PHARMACY WILL DISPENSE MEDICATIONS LISTED ON YOUR MEDICATION LIST TO YOU DURING YOUR ADMISSION IN THE HOSPITAL!    Patients discharged on the day of surgery will not be allowed to drive home.  Someone NEEDS to stay with you for the first 24 hours after anesthesia.   Special Instructions: Bring a copy of your healthcare power of attorney and living will documents the day of surgery if you haven't scanned them before.              Please read over the following fact sheets you were given: IF YOU HAVE QUESTIONS ABOUT YOUR PRE-OP INSTRUCTIONS PLEASE CALL [phone] Ian [name]   If you received a COVID test during your pre-op visit  it is requested  that you wear a mask when out in public, stay away from anyone that may not be feeling well and notify your surgeon if you develop symptoms. If you test positive for Covid or have been in contact with anyone that has tested positive in the last 10 days please notify you surgeon.    [facility] - Preparing for Surgery Before surgery, you can play an important role.  Because skin is not sterile, your skin needs to be as free of germs as possible.  You can reduce the number of germs on your skin by washing with CHG (chlorahexidine gluconate) soap before surgery.  CHG is an antiseptic cleaner which kills germs and bonds with the skin to continue killing germs even after washing. Please DO NOT use if you have an allergy to CHG or antibacterial soaps.  If your skin becomes reddened/irritated stop using the CHG and inform your nurse when you arrive at Short Stay. Do not shave (including legs and underarms) for at least 48 hours prior to the first CHG shower.  You may shave your face/neck.  Please follow these  instructions carefully:  1.  Shower with CHG Soap the night before surgery and the  morning of surgery.  2.  If you choose to wash your hair, wash your hair first as usual with your normal  shampoo.  3.  After you shampoo, rinse your hair and body thoroughly to remove the shampoo.                             4.  Use CHG as you would any other liquid soap.  You can apply chg directly to the skin and wash.  Gently with a scrungie or clean washcloth.  5.  Apply the CHG Soap to your body ONLY FROM THE NECK DOWN.   Do   not use on face/ open                           Wound or open sores. Avoid contact with eyes, ears mouth and   genitals (private parts).                       Wash face,  Genitals (private parts) with your normal soap.             6.  Wash thoroughly, paying special attention to the area where your    surgery  will be performed.  7.  Thoroughly rinse your body with warm water from the neck down.  8.  DO NOT shower/wash with your normal soap after using and rinsing off the CHG Soap.                9.  Pat yourself dry with a clean towel.            10.  Wear clean pajamas.            11.  Place clean sheets on your bed the night of your first shower and do not  sleep with pets. Day of Surgery : Do not apply any lotions/deodorants the morning of surgery.  Please wear clean clothes to the hospital/surgery center.  FAILURE TO FOLLOW THESE INSTRUCTIONS MAY RESULT IN THE CANCELLATION OF YOUR SURGERY  PATIENT SIGNATURE_________________________________  NURSE SIGNATURE__________________________________  ________________________________________________________________________

## 2023-09-21 ENCOUNTER — Other Ambulatory Visit: Payer: Self-pay

## 2023-09-21 ENCOUNTER — Encounter (HOSPITAL_COMMUNITY): Payer: Self-pay

## 2023-09-21 ENCOUNTER — Encounter (HOSPITAL_COMMUNITY)
Admission: RE | Admit: 2023-09-21 | Discharge: 2023-09-21 | Disposition: A | Discharge status: Home / Self Care | Source: Ambulatory Visit | Attending: Urology | Admitting: Urology

## 2023-09-21 VITALS — BP 148/98 | HR 71 | Temp 98.0°F | Resp 16 | Ht 70.5 in | Wt 187.0 lb

## 2023-09-21 DIAGNOSIS — I10 Essential (primary) hypertension: Secondary | ICD-10-CM | POA: Diagnosis not present

## 2023-09-21 DIAGNOSIS — Z01818 Encounter for other preprocedural examination: Secondary | ICD-10-CM | POA: Insufficient documentation

## 2023-09-21 HISTORY — DX: Unspecified osteoarthritis, unspecified site: M19.90

## 2023-09-21 LAB — BASIC METABOLIC PANEL WITH GFR
Anion gap: 8 (ref 5–15)
BUN: 13 mg/dL (ref 8–23)
CO2: 25 mmol/L (ref 22–32)
Calcium: 9.3 mg/dL (ref 8.9–10.3)
Chloride: 105 mmol/L (ref 98–111)
Creatinine, Ser: 0.87 mg/dL (ref 0.61–1.24)
GFR, Estimated: >60 mL/min (ref >60)
Glucose, Bld: 96 mg/dL (ref 70–99)
Potassium: 4.2 mmol/L (ref 3.5–5.1)
Sodium: 138 mmol/L (ref 135–145)

## 2023-09-21 LAB — CBC
HCT: 48.5 % (ref 39.0–52.0)
Hemoglobin: 15.8 g/dL (ref 13.0–17.0)
MCH: 28.7 pg (ref 26.0–34.0)
MCHC: 32.6 g/dL (ref 30.0–36.0)
MCV: 88.2 fL (ref 80.0–100.0)
Platelets: 158 10*3/uL (ref 150–400)
RBC: 5.5 MIL/uL (ref 4.22–5.81)
RDW: 13.2 % (ref 11.5–15.5)
WBC: 5.3 10*3/uL (ref 4.0–10.5)
nRBC: 0 % (ref 0.0–0.2)

## 2023-09-21 LAB — NO BLOOD PRODUCTS

## 2023-09-21 NOTE — Progress Notes (Signed)
 Notification sent to Dr. Alana Hoyle informing him that patient refuses blood products.

## 2023-09-27 ENCOUNTER — Other Ambulatory Visit: Payer: Self-pay | Admitting: Family Medicine

## 2023-09-27 NOTE — Telephone Encounter (Signed)
 Copied from CRM [id number]. Topic: Clinical - Medication Refill >> Sep 27, 2023 11:32 AM Shamecia H wrote: Medication: lisinopril  (ZESTRIL ) 10 MG tablet, bisoprolol -hydrochlorothiazide  (ZIAC ) 10-6.25 MG tablet  Has the patient contacted their pharmacy? Yes (Agent: If no, request that the patient contact the pharmacy for the refill. If patient does not wish to contact the pharmacy document the reason why and proceed with request.) (Agent: If yes, when and what did the pharmacy advise?)  This is the patient's preferred pharmacy:  [facility] [address] [place] - [address] [place] [address] Phone: [phone] Fax: [phone]  Is this the correct pharmacy for this prescription? Yes If no, delete pharmacy and type the correct one.   Has the prescription been filled recently? Yes  Is the patient out of the medication? Yes  Has the patient been seen for an appointment in the last year OR does the patient have an upcoming appointment? Yes  Can we respond through MyChart? Yes  Agent: Please be advised that Rx refills may take up to 3 business days. We ask that you follow-up with your pharmacy.

## 2023-09-27 NOTE — Telephone Encounter (Signed)
 Forwarding message to [employer] covering DR. Augustus Ledger  Requesting rx rf of lisinopril  10mg   Last written 07/01/2021 And Ziac  10-6.25mg   Last written 07/01/2021 Last OV 04/06/2022 Next appt = 10/12/2023 Patient wanting to know if enough medication could be sent to pharmacy to cover until time of upcoming appt ?

## 2023-10-01 ENCOUNTER — Other Ambulatory Visit: Payer: Self-pay

## 2023-10-01 MED ORDER — LISINOPRIL 10 MG PO TABS: 10.0000 mg | ORAL_TABLET | Freq: Every day | ORAL | 0 refills | Status: DC

## 2023-10-01 MED ORDER — BISOPROLOL-HYDROCHLOROTHIAZIDE 10-6.25 MG PO TABS: 1.0000 | ORAL_TABLET | Freq: Every day | ORAL | 0 refills | Status: DC

## 2023-10-01 NOTE — Telephone Encounter (Signed)
 Just FYI_Spoke with patient - attempted to move up to sooner appt but patient was not available any sooner than scheduled appt on 10/12/2023. He states he will keep this appointment.

## 2023-10-01 NOTE — Telephone Encounter (Signed)
 Refilled as 30 day supply. Called patient and left a detailed voice mail message on listed home #

## 2023-10-01 NOTE — H&P (Signed)
 Office Visit Report     09/27/2023   --------------------------------------------------------------------------------   Ian [name]  MRN: [id number]  DOB: 06/27/1960, 63 year old Male  SSN:    PRIMARY CARE:  Abundio Hoit, DO  PRIMARY CARE FAX:  [phone]  REFERRING:  Margery Sheets, MD  PROVIDER:  Christina Coyer, M.D.  LOCATION:  [facility] [phone]     --------------------------------------------------------------------------------   CC/HPI: F/u -   1) BPH -he tried tamsulosin 0.4 and had continued urinary symptoms. Increased to 0.8 but had bothersome RGE. A weak stream since 2015. He stopped alpha blockers. His September 2024 PSA was 1.9. October 2024 cystoscopy with Dr. Elvan Hamel revealed BPH and a large median lobe. He was prescribed dutasteride in October 2024. Backed off caffeine and etoh. IPSS 17. No surgery. No NG risk.   He underwent a March 2025 prostate ultrasound which measured a 78 g prostate with a 16 g median lobe. I measured myself and thought the prostate was about 30 g and the median lobe about 15 for 45 g. He underwent a PVR which was 262 he had a UroCuff with a Q-Maxx of 8.2 cc/s. He continues to have a very weak stream and would like help.   Today, seen for the above. No OAC. No dysuria or gross hematuria. No CP or SOB. He still does pushups, planks. He ran out of his bisoprolol  and will call PCP to refill.   UA clear.   He is retired and was Psychologist, occupational.     ALLERGIES: No Allergies    MEDICATIONS: Lisinopril  10 MG Tablet 1 tablet PO Daily  Tamsulosin HCl 0.4 MG Capsule 1 capsule PO Daily  Beta Carotene 1 PO Daily  Bisoprolol -hydroCHLOROthiazide  10-6.25 MG Tablet 1 tablet PO Daily  Olive Oil 1 PO Daily  Vitamin C 1 PO Daily  Vitamin D3 1 PO Daily  Zinc 50 MG Tablet 1 tablet PO Daily     GU PSH: Complex cystometrogram, with voiding pressure studies, any technique - 07/06/2023 Complex Uroflow - 07/06/2023 Emg surf  Electrd - 07/06/2023     NON-GU PSH: Visit Complexity (formerly GPC1X) - 07/21/2023, 06/04/2023     GU PMH: BPH w/LUTS, We went over his anatomy of his prostate and prostate size. We again discussed the management of BPH and the nature risk and benefits of continued medication or procedures. He is a good candidate for [place] TTE, laser vaporization or enucleation or aqua ablation. Also we discussed TURP. I thought he did very well with laser vaporization and just focus on the median lobe and leave lateral lobe tissue. He really would like to preserve ejaculation and has done a lot of research and would like to proceed with aqua ablation. I discussed with the patient the nature r/b/a to AQB of prostate including side effects of the procedure, expected post-op course and likelihood of success. We discussed flow symptoms and irritative symptoms typically improve, but frequency and urgency can persist and rarely worsen. We also discussed risk of bleeding, infection, stricture, sexual dysfunction and incontinence among others. We also discussed expectations for a staged or repeat procedure in the future. All questions answered. He elects to proceed. Discussed he will need a preop appointment. He would like his PSA checked. That is fine as well. Also good news he lost about 45 pounds because he decreased alcohol intake and his caloric intake. - 07/21/2023, - 07/06/2023, - 06/04/2023 Weak Urinary Stream - 07/21/2023, - 07/06/2023, We discussed the nature  r/b of surveillance, alpha blocker, 5ari, daily pde5i, OAB meds and procedures such as TURP, OBD, LVP, LEP, RWJ, RSLP in the OR or PUL or WVT in the office. Doesn't want meds but might consider dutasteride temporarily. Check UC and a pUS and then I'll turn in sch for WVTT or AQB. , - 06/04/2023    NON-GU PMH: No Non-GU PMH    FAMILY HISTORY: Heart Attack - Father rectal cancer - Mother   SOCIAL HISTORY: Marital Status: Single Current Smoking Status: Patient does not  smoke anymore. Has not smoked since 05/21/2009. Smoked for 4 years.   Tobacco Use Assessment Completed: Used Tobacco in last 30 days?    REVIEW OF SYSTEMS:    GU Review Male:   Patient reports frequent urination, hard to postpone urination, get up at night to urinate, and trouble starting your stream. Patient denies burning/ pain with urination, leakage of urine, stream starts and stops, have to strain to urinate , erection problems, and penile pain.  Gastrointestinal (Upper):   Patient denies nausea, vomiting, and indigestion/ heartburn.  Gastrointestinal (Lower):   Patient denies diarrhea and constipation.  Constitutional:   Patient denies weight loss, fatigue, fever, and night sweats.  Skin:   Patient denies skin rash/ lesion and itching.  Eyes:   Patient denies blurred vision and double vision.  Ears/ Nose/ Throat:   Patient denies sore throat and sinus problems.  Hematologic/Lymphatic:   Patient denies swollen glands and easy bruising.  Cardiovascular:   Patient denies leg swelling and chest pains.  Respiratory:   Patient denies cough and shortness of breath.  Endocrine:   Patient denies excessive thirst.  Musculoskeletal:   Patient denies back pain and joint pain.  Neurological:   Patient denies headaches and dizziness.  Psychologic:   Patient denies depression and anxiety.   VITAL SIGNS: None   MULTI-SYSTEM PHYSICAL EXAMINATION:    Constitutional: Well-nourished. No physical deformities. Normally developed. Good grooming.  Neck: Neck symmetrical, not swollen. Normal tracheal position.  Respiratory: No labored breathing, no use of accessory muscles.   Cardiovascular: Normal temperature, normal extremity pulses, no swelling, no varicosities.  Skin: No paleness, no jaundice, no cyanosis. No lesion, no ulcer, no rash.  Neurologic / Psychiatric: Oriented to time, oriented to place, oriented to person. No depression, no anxiety, no agitation.  Gastrointestinal: No mass, no tenderness, no  rigidity, non obese abdomen.     Complexity of Data:  Lab Test Review:   PSA, BUN/Creatinine  Records Review:   AUA Symptom Score, POC Tool   PROCEDURES:          Visit Complexity - G2211 Chronic management         Urinalysis Dipstick Dipstick Cont'd  Color: Yellow Bilirubin: Neg mg/dL  Appearance: Clear Ketones: Neg mg/dL  Specific Gravity: 9.604 Blood: Neg ery/uL  pH: 7.5 Protein: Trace mg/dL  Glucose: Neg mg/dL Urobilinogen: 0.2 mg/dL    Nitrites: Neg    Leukocyte Esterase: Neg leu/uL    ASSESSMENT:      ICD-10 Details  1 GU:   BPH w/LUTS - N40.1 Chronic, Stable - I discussed with the patient the nature, potential benefits, risks and alternatives to robotic water jet ablation of prostate, including side effects of the proposed treatment, the likelihood of the patient achieving the goals of the procedure, and any potential problems that might occur during the procedure or recuperation. Discussed expectaions again for flow vs irritative symptoms. We will preserve ejacualtion. All questions answered. Patient elects to proceed. Also  discussed 5ari and PAe. Also HoLEP. cr 0.87 jun 2025. hgb 15.8. Discussed cardioprotective effect of beta-blockers and again he will call to refill.  2   Weak Urinary Stream - R39.12 Chronic, Stable   PLAN:           Schedule Return Visit/Planned Activity: Keep Scheduled Appointment - Extender  Return Visit/Planned Activity: Keep Scheduled Appointment - Schedule Surgery          Document Letter(s):  Created for Patient: Clinical Summary         Notes:   cc: Dr. Augustus Ledger         Next Appointment:      Next Appointment: 10/05/2023 09:30 AM    Appointment Type: Surgery     Location: [facility] [phone]    Provider: Christina Coyer, M.D.    Reason for Visit: OP--WL--AQUABLATION      * Signed by Christina Coyer, M.D. on 09/27/23 at 3:52 PM (EDT*      The information contained in this medical record document is  considered private and confidential patient information. This information can only be used for the medical diagnosis and/or medical services that are being provided by the patient's selected caregivers. This information can only be distributed outside of the patient's care if the patient agrees and signs waivers of authorization for this information to be sent to an outside source or route.

## 2023-10-04 NOTE — Anesthesia Preprocedure Evaluation (Addendum)
 Anesthesia Evaluation  Patient identified by MRN, date of birth, ID band Patient awake    Reviewed: Allergy & Precautions, NPO status , Patient's Chart, lab work & pertinent test results  History of Anesthesia Complications Negative for: history of anesthetic complications  Airway Mallampati: II  TM Distance: >3 FB Neck ROM: Full    Dental no notable dental hx.    Pulmonary former smoker   Pulmonary exam normal        Cardiovascular hypertension, Pt. on medications Normal cardiovascular exam     Neuro/Psych negative neurological ROS     GI/Hepatic negative GI ROS, Neg liver ROS,,,  Endo/Other  negative endocrine ROS    Renal/GU negative Renal ROS     Musculoskeletal  (+) Arthritis ,    Abdominal   Peds  Hematology  (+) REFUSES BLOOD PRODUCTS (Patient refuses blood products even in event of life threatening hemorrhage. States reasoning as I don't want blood from anyone who's had the Covid vaccine.)  Anesthesia Other Findings BPH  Reproductive/Obstetrics                             Anesthesia Physical Anesthesia Plan  ASA: 2  Anesthesia Plan: General   Post-op Pain Management: Tylenol PO (pre-op)*   Induction: Intravenous  PONV Risk Score and Plan: 2 and Treatment may vary due to age or medical condition, Ondansetron, Dexamethasone and Midazolam  Airway Management Planned: Oral ETT  Additional Equipment: None  Intra-op Plan:   Post-operative Plan: Extubation in OR  Informed Consent: I have reviewed the patients History and Physical, chart, labs and discussed the procedure including the risks, benefits and alternatives for the proposed anesthesia with the patient or authorized representative who has indicated his/her understanding and acceptance.     Dental advisory given  Plan Discussed with: CRNA  Anesthesia Plan Comments:        Anesthesia Quick Evaluation

## 2023-10-05 ENCOUNTER — Encounter (HOSPITAL_COMMUNITY): Payer: Self-pay | Admitting: Urology

## 2023-10-05 ENCOUNTER — Other Ambulatory Visit: Payer: Self-pay

## 2023-10-05 ENCOUNTER — Ambulatory Visit (HOSPITAL_BASED_OUTPATIENT_CLINIC_OR_DEPARTMENT_OTHER): Payer: Self-pay | Admitting: Anesthesiology

## 2023-10-05 ENCOUNTER — Ambulatory Visit (HOSPITAL_COMMUNITY)
Admission: RE | Admit: 2023-10-05 | Discharge: 2023-10-05 | Disposition: A | Discharge status: Home / Self Care | Attending: Urology | Admitting: Urology

## 2023-10-05 ENCOUNTER — Ambulatory Visit (HOSPITAL_COMMUNITY): Payer: Self-pay | Admitting: Anesthesiology

## 2023-10-05 ENCOUNTER — Encounter (HOSPITAL_COMMUNITY)
Admission: RE | Disposition: A | Discharge status: Home / Self Care | Payer: Self-pay | Source: Home / Self Care | Attending: Urology

## 2023-10-05 DIAGNOSIS — R3912 Poor urinary stream: Secondary | ICD-10-CM | POA: Insufficient documentation

## 2023-10-05 DIAGNOSIS — N138 Other obstructive and reflux uropathy: Secondary | ICD-10-CM | POA: Diagnosis not present

## 2023-10-05 DIAGNOSIS — N401 Enlarged prostate with lower urinary tract symptoms: Secondary | ICD-10-CM

## 2023-10-05 DIAGNOSIS — Z79899 Other long term (current) drug therapy: Secondary | ICD-10-CM | POA: Insufficient documentation

## 2023-10-05 DIAGNOSIS — Z87891 Personal history of nicotine dependence: Secondary | ICD-10-CM | POA: Insufficient documentation

## 2023-10-05 SURGERY — ABLATION, PROSTATE, TRANSURETHRAL, USING WATERJET
Anesthesia: General

## 2023-10-05 MED ORDER — PROPOFOL 10 MG/ML IV BOLUS
INTRAVENOUS | Status: DC | PRN
  Administered 2023-10-05: 170 mg via INTRAVENOUS
  Administered 2023-10-05: 30 mg via INTRAVENOUS

## 2023-10-05 MED ORDER — FENTANYL CITRATE (PF) 100 MCG/2ML IJ SOLN
INTRAMUSCULAR | Status: DC | PRN
  Administered 2023-10-05: 100 ug via INTRAVENOUS
  Administered 2023-10-05: 50 ug via INTRAVENOUS

## 2023-10-05 MED ORDER — DEXAMETHASONE SODIUM PHOSPHATE 10 MG/ML IJ SOLN
INTRAMUSCULAR | Status: DC | PRN
  Administered 2023-10-05: 10 mg via INTRAVENOUS

## 2023-10-05 MED ORDER — ORAL CARE MOUTH RINSE
15.0000 mL | Freq: Once | OROMUCOSAL | Status: AC
Start: 2023-10-05 — End: 2023-10-05

## 2023-10-05 MED ORDER — ROCURONIUM BROMIDE 100 MG/10ML IV SOLN
INTRAVENOUS | Status: DC | PRN
  Administered 2023-10-05: 10 mg via INTRAVENOUS
  Administered 2023-10-05: 60 mg via INTRAVENOUS
  Administered 2023-10-05: 10 mg via INTRAVENOUS

## 2023-10-05 MED ORDER — ONDANSETRON HCL 4 MG/2ML IJ SOLN
INTRAMUSCULAR | Status: DC | PRN
  Administered 2023-10-05: 4 mg via INTRAVENOUS

## 2023-10-05 MED ORDER — FENTANYL CITRATE (PF) 100 MCG/2ML IJ SOLN
INTRAMUSCULAR | Status: AC
  Filled 2023-10-05: qty 2

## 2023-10-05 MED ORDER — OXYCODONE HCL 5 MG/5ML PO SOLN: 5.0000 mg | Freq: Once | ORAL | Status: AC | PRN

## 2023-10-05 MED ORDER — PHENYLEPHRINE 80 MCG/ML (10ML) SYRINGE FOR IV PUSH (FOR BLOOD PRESSURE SUPPORT)
PREFILLED_SYRINGE | INTRAVENOUS | Status: AC
Start: 2023-10-05 — End: 2023-10-05
  Filled 2023-10-05: qty 10

## 2023-10-05 MED ORDER — SODIUM CHLORIDE 0.9 % IR SOLN
Status: DC | PRN
  Administered 2023-10-05 (×2): 3000 mL via INTRAVESICAL
  Administered 2023-10-05: 1500 mL via INTRAVESICAL

## 2023-10-05 MED ORDER — TRANEXAMIC ACID-NACL 1000-0.7 MG/100ML-% IV SOLN
1000.0000 mg | INTRAVENOUS | Status: AC
  Administered 2023-10-05: 1000 mg via INTRAVENOUS
  Filled 2023-10-05: qty 100

## 2023-10-05 MED ORDER — STERILE WATER FOR IRRIGATION IR SOLN
Status: DC | PRN
  Administered 2023-10-05: 500 mL

## 2023-10-05 MED ORDER — FENTANYL CITRATE PF 50 MCG/ML IJ SOSY
25.0000 ug | PREFILLED_SYRINGE | INTRAMUSCULAR | Status: DC | PRN
  Administered 2023-10-05 (×3): 50 ug via INTRAVENOUS

## 2023-10-05 MED ORDER — FENTANYL CITRATE PF 50 MCG/ML IJ SOSY
PREFILLED_SYRINGE | INTRAMUSCULAR | Status: AC
  Filled 2023-10-05: qty 1

## 2023-10-05 MED ORDER — MIDAZOLAM HCL 2 MG/2ML IJ SOLN
INTRAMUSCULAR | Status: AC
  Filled 2023-10-05: qty 2

## 2023-10-05 MED ORDER — OXYCODONE HCL 5 MG PO TABS
ORAL_TABLET | ORAL | Status: AC
  Filled 2023-10-05: qty 1

## 2023-10-05 MED ORDER — SODIUM CHLORIDE 0.9 % IV SOLN
2.0000 g | INTRAVENOUS | Status: AC
  Administered 2023-10-05: 2 g via INTRAVENOUS
  Filled 2023-10-05: qty 20

## 2023-10-05 MED ORDER — FENTANYL CITRATE (PF) 100 MCG/2ML IJ SOLN
INTRAMUSCULAR | Status: AC
Start: 2023-10-05 — End: 2023-10-05
  Filled 2023-10-05: qty 2

## 2023-10-05 MED ORDER — NITROFURANTOIN MONOHYD MACRO 100 MG PO CAPS: 100.0000 mg | ORAL_CAPSULE | Freq: Every day | ORAL | 0 refills | Status: AC

## 2023-10-05 MED ORDER — OXYCODONE HCL 5 MG PO TABS
5.0000 mg | ORAL_TABLET | Freq: Once | ORAL | Status: AC | PRN
  Administered 2023-10-05: 5 mg via ORAL

## 2023-10-05 MED ORDER — DROPERIDOL 2.5 MG/ML IJ SOLN: 0.6250 mg | Freq: Once | INTRAMUSCULAR | Status: DC | PRN

## 2023-10-05 MED ORDER — ONDANSETRON HCL 4 MG/2ML IJ SOLN: INTRAMUSCULAR | Status: DC | PRN

## 2023-10-05 MED ORDER — PHENYLEPHRINE 80 MCG/ML (10ML) SYRINGE FOR IV PUSH (FOR BLOOD PRESSURE SUPPORT)
PREFILLED_SYRINGE | INTRAVENOUS | Status: DC | PRN
  Administered 2023-10-05 (×2): 160 ug via INTRAVENOUS
  Administered 2023-10-05: 80 ug via INTRAVENOUS

## 2023-10-05 MED ORDER — ACETAMINOPHEN 500 MG PO TABS
1000.0000 mg | ORAL_TABLET | Freq: Once | ORAL | Status: AC
  Administered 2023-10-05: 1000 mg via ORAL
  Filled 2023-10-05: qty 2

## 2023-10-05 MED ORDER — LACTATED RINGERS IV SOLN: INTRAVENOUS | Status: DC

## 2023-10-05 MED ORDER — SUGAMMADEX SODIUM 200 MG/2ML IV SOLN
INTRAVENOUS | Status: DC | PRN
  Administered 2023-10-05: 200 mg via INTRAVENOUS

## 2023-10-05 MED ORDER — CHLORHEXIDINE GLUCONATE 0.12 % MT SOLN
15.0000 mL | Freq: Once | OROMUCOSAL | Status: AC
  Administered 2023-10-05: 15 mL via OROMUCOSAL

## 2023-10-05 MED ORDER — LIDOCAINE HCL (CARDIAC) PF 100 MG/5ML IV SOSY
PREFILLED_SYRINGE | INTRAVENOUS | Status: DC | PRN
  Administered 2023-10-05: 100 mg via INTRATRACHEAL

## 2023-10-05 MED ORDER — 0.9 % SODIUM CHLORIDE (POUR BTL) OPTIME
TOPICAL | Status: DC | PRN
  Administered 2023-10-05: 1000 mL

## 2023-10-05 MED ORDER — MIDAZOLAM HCL 2 MG/2ML IJ SOLN
INTRAMUSCULAR | Status: DC | PRN
  Administered 2023-10-05: 2 mg via INTRAVENOUS

## 2023-10-05 MED ORDER — FENTANYL CITRATE PF 50 MCG/ML IJ SOSY
PREFILLED_SYRINGE | INTRAMUSCULAR | Status: AC
  Filled 2023-10-05: qty 2

## 2023-10-05 SURGICAL SUPPLY — 26 items
BAG URINE DRAIN 2000ML AR STRL (UROLOGICAL SUPPLIES) ×1 IMPLANT
BAND RUBBER #18 3X1/16 STRL (MISCELLANEOUS) IMPLANT
CATH HEMA 3WAY 30CC 22FR COUDE (CATHETERS) IMPLANT
CATH HEMA 3WAY 30CC 24FR COUDE (CATHETERS) IMPLANT
COVER MAYO STAND STRL (DRAPES) ×1 IMPLANT
DRAPE FOOT SWITCH (DRAPES) ×1 IMPLANT
DRAPE SURG IRRIG POUCH 19X23 (DRAPES) IMPLANT
GEL ULTRASOUND 8.5O AQUASONIC (MISCELLANEOUS) ×1 IMPLANT
GLOVE SURG LX STRL 7.5 STRW (GLOVE) ×1 IMPLANT
GOWN STRL REUS W/ TWL XL LVL3 (GOWN DISPOSABLE) ×1 IMPLANT
HANDPIECE AQUABEAM (MISCELLANEOUS) ×1 IMPLANT
HOLDER FOLEY CATH W/STRAP (MISCELLANEOUS) IMPLANT
KIT TURNOVER KIT A (KITS) ×2 IMPLANT
LOOP CUT BIPOLAR 24F LRG (ELECTROSURGICAL) IMPLANT
MANIFOLD NEPTUNE II (INSTRUMENTS) ×1 IMPLANT
MAT ABSORB FLUID 56X50 GRAY (MISCELLANEOUS) ×1 IMPLANT
PACK CYSTO (CUSTOM PROCEDURE TRAY) ×1 IMPLANT
PACK DRAPE AQUABEAM (MISCELLANEOUS) ×1 IMPLANT
PAD PREP 24X48 CUFFED NSTRL (MISCELLANEOUS) ×1 IMPLANT
PIN SAFETY STERILE (MISCELLANEOUS) IMPLANT
SYR 30ML LL (SYRINGE) ×1 IMPLANT
SYRINGE TOOMEY IRRIG 70ML (MISCELLANEOUS) ×2 IMPLANT
TOWEL OR 17X26 10 PK STRL BLUE (TOWEL DISPOSABLE) ×1 IMPLANT
TUBING CONNECTING 10 (TUBING) ×2 IMPLANT
TUBING UROLOGY SET (TUBING) ×1 IMPLANT
UNDERPAD 30X36 HEAVY ABSORB (UNDERPADS AND DIAPERS) ×1 IMPLANT

## 2023-10-05 NOTE — Transfer of Care (Signed)
 Immediate Anesthesia Transfer of Care Note  Patient: Ian [name]  Procedure(s) Performed: Raydell Cahill OF THE PROSTATE  Patient Location: PACU  Anesthesia Type:General  Level of Consciousness: awake, drowsy, and patient cooperative  Airway & Oxygen Therapy: Patient Spontanous Breathing and Patient connected to face mask oxygen  Post-op Assessment: Report given to RN and Post -op Vital signs reviewed and stable  Post vital signs: stable  Last Vitals:  Vitals Value Taken Time  BP 162/100 10/05/23 11:37  Temp    Pulse 65 10/05/23 11:40  Resp 12 10/05/23 11:40  SpO2 100 % 10/05/23 11:40  Vitals shown include unfiled device data.  Last Pain:  Vitals:   10/05/23 0753  TempSrc: Oral         Complications: No notable events documented.

## 2023-10-05 NOTE — Discharge Instructions (Signed)
Robotic water jet ablation of the Prostate, Care After The following information offers guidance on how to care for yourself after your procedure. Your health care provider may also give you more specific instructions. If you have problems or questions, contact your health care provider. What can I expect after the procedure? After the procedure, it is common to have: Mild pain in your lower abdomen. Soreness or mild discomfort in your penis or when you urinate. This is from having the catheter inserted during the procedure. A sudden urge to urinate (urgency). A need to urinate often. A small amount of blood in your urine. You may notice some small blood clots in your urine. These are normal. Follow these instructions at home: Medicines Take over-the-counter and prescription medicines only as told by your health care provider. If you were prescribed an antibiotic medicine, take it as told by your health care provider. Do not stop taking the antibiotic even if you start to feel better. Activity  Rest as told by your health care provider. Avoid sitting for a long time without moving. Get up to take short walks every 1-2 hours. This is important to improve blood flow and breathing. Ask for help if you feel weak or unsteady. You may increase your physical activity gradually as you start to feel better. Do not drive or operate machinery until your health care provider says that it is safe. Do not ride in a car for long periods of time, or as told by your health care provider. Avoid intense physical activity for as long as told by your health care provider. Do not lift anything that is heavier than 10 lb (4.5 kg), or the limit that you are told, until your health care provider says that it is safe. Do not have sex until your health care provider approves. Return to your normal activities as told by your health care provider. Ask your health care provider what activities are safe for you. Preventing  constipation  You may need to take these actions to prevent or treat constipation: Drink enough fluid to keep your urine pale yellow. Take over-the-counter or prescription medicines. Eat foods that are high in fiber, such as beans, whole grains, and fresh fruits and vegetables. Limit foods that are high in fat and processed sugars, such as fried or sweet foods.   General instructions Do not strain when you have a bowel movement. Straining may lead to bleeding from the prostate. This may cause blood clots and trouble urinating. Do not use any products that contain nicotine or tobacco. These products include cigarettes, chewing tobacco, and vaping devices, such as e-cigarettes. If you need help quitting, ask your health care provider. If you go home with a tube draining your urine (urinary catheter), care for the catheter as told by your health care provider. Wear compression stockings as told by your health care provider. These stockings help to prevent blood clots and reduce swelling in your legs. Keep all follow-up visits. This is important. Contact a health care provider if: You have signs of infection, such as: Fever or chills. Urine that smells very bad. Swelling around your urethra that is getting worse. Swelling in your penis or testicles. You have difficulty urinating. You have pain that gets worse or does not improve with medicine. You have blood in your urine that does not go away after 1 week of resting and drinking more fluids. You have trouble having a bowel movement. You have trouble having or keeping an erection. No  semen comes out during orgasm (dry ejaculation). You have a urinary catheter in place, and you have: Spasms or pain. Problems with your catheter or your catheter is blocked. Get help right away if: You are unable to urinate. You are having more blood clots in your urine instead of fewer. You have: Large blood clots. A lot of blood in your urine. Pain in  your back or lower abdomen. You have difficulty breathing or shortness of breath. You develop swelling or pain in your leg. These symptoms may be an emergency. Get help right away. Call 911. Do not wait to see if the symptoms will go away. Do not drive yourself to the hospital. Summary After the procedure, it is common to have a small amount of blood in your urine. Follow restrictions about lifting and sexual activity as told by your health care provider. Ask what activities are safe for you. Keep all follow-up visits. This is important. This information is not intended to replace advice given to you by your health care provider. Make sure you discuss any questions you have with your health care provider. Document Revised: 12/31/2020 Document Reviewed: 12/31/2020 Elsevier Patient Education  2024 [employer].

## 2023-10-05 NOTE — Progress Notes (Signed)
 I was notified after the patient was put to sleep that he had signed an order that he would not except a blood transfusion.  This is not something I discussed with Joe or he discussed with me although we did discuss the risk of bleeding with the procedure.  We discussed he would get TXA to help prevent bleeding but he never expressed his concern with getting a blood transfusion.  I called and talked to Antoine Bathe and we discussed there was possibly a 1 to 2% risk of needing a blood transfusion but it would is rarely needed during the procedure and would be needed postop when I could discuss further with Joe.  She consented to proceed with the procedure and not wake Joe up to discuss and then follow-up and do the procedure on another day which I think is reasonable.  Also his prostate is not over 100 g which I also feel like will decrease the risk of bleeding.

## 2023-10-05 NOTE — Interval H&P Note (Signed)
 History and Physical Interval Note:  10/05/2023 9:10 AM  Ian [name]  has presented today for surgery, with the diagnosis of BENIGN PROSTATIC HYPERPLASIA.  The various methods of treatment have been discussed with the patient and family. After consideration of risks, benefits and other options for treatment, the patient has consented to  Procedure(s): ABLATION, PROSTATE, TRANSURETHRAL, USING WATERJET (N/A) as a surgical intervention.  The patient's history has been reviewed, patient examined, no change in status, stable for surgery.  I have reviewed the patient's chart and labs.  Questions were answered to the patient's satisfaction.  He is well. No dysuria or hematuria. No cough, cold or congestion.    Christina Coyer

## 2023-10-05 NOTE — Anesthesia Procedure Notes (Signed)
 Procedure Name: Intubation Date/Time: 10/05/2023 9:58 AM  Performed by: Maryanna Smart, CRNAPre-anesthesia Checklist: Patient identified, Emergency Drugs available, Suction available, Patient being monitored and Timeout performed Patient Re-evaluated:Patient Re-evaluated prior to induction Oxygen Delivery Method: Circle system utilized Preoxygenation: Pre-oxygenation with 100% oxygen Induction Type: IV induction Ventilation: Mask ventilation without difficulty Laryngoscope Size: Miller and 2 Grade View: Grade I Tube type: Oral Tube size: 7.0 mm Number of attempts: 1 Airway Equipment and Method: Stylet Placement Confirmation: positive ETCO2, ETT inserted through vocal cords under direct vision, CO2 detector and breath sounds checked- equal and bilateral Secured at: 23 cm Tube secured with: Tape Dental Injury: Teeth and Oropharynx as per pre-operative assessment

## 2023-10-05 NOTE — Anesthesia Postprocedure Evaluation (Signed)
 Anesthesia Post Note  Patient: Ian [name]  Procedure(s) Performed: AQUABLATION OF THE PROSTATE     Patient location during evaluation: PACU Anesthesia Type: General Level of consciousness: awake and alert Pain management: pain level controlled Vital Signs Assessment: post-procedure vital signs reviewed and stable Respiratory status: spontaneous breathing, nonlabored ventilation and respiratory function stable Cardiovascular status: blood pressure returned to baseline Postop Assessment: no apparent nausea or vomiting Anesthetic complications: no   No notable events documented.  Last Vitals:  Vitals:   10/05/23 1230 10/05/23 1245  BP: (!) 157/97 (!) 165/100  Pulse: (!) 57 61  Resp: 10 14  Temp:    SpO2: 100% 100%    Last Pain:  Vitals:   10/05/23 1245  TempSrc:   PainSc: 3                  Rayfield Cairo

## 2023-10-05 NOTE — Op Note (Signed)
 Preoperative diagnosis: BPH with lower urinary tract symptoms, weak stream, frequency  Postoperative diagnosis: Same   Procedure: Robotic water jet ablation of the prostate   Surgeon: Derrick Fling   Anesthesia: General   Indication for procedure:   Findings:  EUA 50 g prostate - benign  Cystoscopy revealed lateral lobe hypertrophy and intravesical extension of prostate.   Description of procedure:  He was brought to the operating room and placed supine on the operating table.  After adequate anesthesia he was placed lithotomy position. Timeout was performed to confirm the patient and procedure. The TRUS Stepper was mounted to the Articulating Arm and secured to OR bed. The ultrasound probe was attached to the stepper. Exam under anesthesia was performed and the TRUS was inserted per rectum.  There was no resistance. The ultrasound probe was aligned, and confirmation made that the prostate is centered and aligned using both transverse and sagittal views. The bladder neck, verumontanum and the central/transition zones were identified.  Genitalia were prepped and draped in the usual sterile fashion. The 22F AQUABEAM Handpiece is inserted into the prostatic urethra and a complete cystoscopic evaluation was performed by inspecting the prostate, bladder, and identifying the location of the verumontanum/external sphincter. The AQUABEAM Handpiece was secured to the Handpiece Articulating Arm. Confirmed alignment of AQUABEAM Handpiece and TRUS Probe to be parallel and colinear. Confirmation that AQUABEAM nozzle is centered and anterior of the bladder neck or the median lobe. The cystoscope was then retracted to visualize the verumontanum and external sphincter and the cystoscope tip was positioned just proximal to the external sphincter. Reconfirmed alignment of the TRUS probe with the AQUABEAM Handpiece and compression applied with TRUS probe. Horizontal alignment of the Handpiece waterjet nozzle was performed.  The Aquablation treatment zones were planned utilizing real-time TRUS to visualize the contour of the prostate and the depth and radial angles of resection were defined in the transverse view. In the sagittal view, the AQUABEAM nozzle is identified and position registered with software. The treatment contours were then adjusted to conform to the intended resection margins. The median lobe, bladder neck and verumontanum were marked and confirmed in the treatment contour. The Aquablation Treatment was then started following the resection contour confirmed under ultrasound guidance.  TOTAL AQUABLATION RESECTION TIME: first pass 4:02, second pass 3:48  Once Aquablation resection was complete the 24 French aqua beam handpiece was carefully removed.  The continuous-flow sheath with the visual obturator was passed and then the loop and handle.  The trigone and the ureteral orifices were identified.  Resection of some of the residual median lobe and bladder neck tissue was done.  The bladder neck was identified at 6:00 and this was taken up to 12:00 with fulguration of the bladder neck and prostate for hemostasis.  Slight amount of anterior tissue was resected.  Similarly from 6:00 up to 12:00 on the left side of the bladder neck was identified by resecting some of the ablated tissue to identify the bladder neck and cauterize any bleeding.  Some anterior tissue on the left was resected.  This created excellent hemostasis.  All the chips were evacuated. More right intravesical tissue than left resected. Ureteral orifices again identified and noted to be normal without injury.  The scope was backed out and a 22 [place] hematuria catheter was placed with 30 cc in the balloon.  The balloon was seated at the bladder neck and it was irrigated on light traction and noted to be clear to pink.  He was hooked  up to CBI.  He was cleaned up and placed supine.  Catheter was placed on traction.  He was awakened and taken to the cover  room in stable condition.  Complications: None  Blood loss: 75 mL  Specimens: TURP chips to pathology   Drains: 22 French three-way hematuria catheter with 30 cc in the balloon  Disposition: Patient stable to PACU

## 2023-10-06 LAB — SURGICAL PATHOLOGY

## 2023-10-12 ENCOUNTER — Encounter: Payer: Self-pay | Admitting: Family Medicine

## 2023-10-12 ENCOUNTER — Ambulatory Visit (INDEPENDENT_AMBULATORY_CARE_PROVIDER_SITE_OTHER): Admitting: Family Medicine

## 2023-10-12 VITALS — BP 102/63 | HR 58 | Ht 70.5 in | Wt 191.0 lb

## 2023-10-12 DIAGNOSIS — E785 Hyperlipidemia, unspecified: Secondary | ICD-10-CM

## 2023-10-12 DIAGNOSIS — I10 Essential (primary) hypertension: Secondary | ICD-10-CM | POA: Diagnosis not present

## 2023-10-12 MED ORDER — LISINOPRIL 10 MG PO TABS: 10.0000 mg | ORAL_TABLET | Freq: Every day | ORAL | 3 refills | Status: AC

## 2023-10-12 NOTE — Assessment & Plan Note (Signed)
 Blood pressure mains well controlled current medications.  We will continue current medications at current strength.  Return in about 1 year (around 10/11/2024) for Hypertension.

## 2023-10-12 NOTE — Assessment & Plan Note (Signed)
Update lipid panel today 

## 2023-10-12 NOTE — Progress Notes (Signed)
 Ian [name] - 63 y.o. male MRN [id number]  Date of birth: [birth year]  Subjective Chief Complaint  Patient presents with   Hypertension    HPI Ian [name] is a 63 y.o. male here today for follow up.    He has history of prostate cancer and and had ablation with urology last week.  Foley is out and he is voiding well at this time.   Blood pressure is well-controlled with lisinopril  10 mg daily.  He has not been taking bisoprolol /HCTZ.  He denies side effects with current medication.  Has not had chest pain, shortness of breath, palpitations, headaches or vision changes.  ROS:  A comprehensive ROS was completed and negative except as noted per HPI  Allergies  Allergen Reactions   Other     Refuses blood products.    Past Medical History:  Diagnosis Date   Arthritis    Degenerative disc disease, lumbar    Hypertension     Past Surgical History:  Procedure Laterality Date   EXPLORATORY LAPAROTOMY     HERNIA REPAIR      Social History   Socioeconomic History   Marital status: Single    Spouse name: Not on file   Number of children: 2   Years of education: 27   Highest education level: 12th grade  Occupational History   Occupation: Legally disabled.  Tobacco Use   Smoking status: Former   Smokeless tobacco: Never  Vaping Use   Vaping status: Never Used  Substance and Sexual Activity   Alcohol use: Yes    Alcohol/week: 2.0 standard drinks of alcohol    Types: 1 Cans of beer, 1 Shots of liquor per week    Comment: Daily   Drug use: Never   Sexual activity: Not Currently  Other Topics Concern   Not on file  Social History Narrative   Lives with adult companion. He has two daughters. He enjoys playing cards.   Social Drivers of Corporate investment banker Strain: Low Risk  (12/29/2022)   Received from [facility]   Overall Financial Resource Strain (CARDIA)    Difficulty of Paying Living Expenses: Not hard at all  Food Insecurity: No Food Insecurity  (12/29/2022)   [facility]   Hunger Vital Sign    Within the past 12 months, you worried that your food would run out before you got the money to buy more.: Never true    Within the past 12 months, the food you bought just didn't last and you didn't have money to get more.: Never true  Transportation Needs: No Transportation Needs (12/29/2022)   Received from [facility] - Transportation    Lack of Transportation (Medical): No    Lack of Transportation (Non-Medical): No  Physical Activity: Inactive (04/06/2022)   Exercise Vital Sign    Days of Exercise per Week: 0 days    Minutes of Exercise per Session: 0 min  Stress: No Stress Concern Present (04/06/2022)   [employer] of Occupational Health - Occupational Stress Questionnaire    Feeling of Stress : Not at all  Social Connections: Unknown (04/06/2022)   Social Connection and Isolation Panel    Frequency of Communication with Friends and Family: More than three times a week    Frequency of Social Gatherings with Friends and Family: Twice a week    Attends Religious Services: Never    Database administrator or Organizations: No    Attends Banker  Meetings: Never    Marital Status: Patient declined    Family History  Problem Relation Age of Onset   Diabetes Mother    Colon cancer Mother    Hypertension Father    Heart attack Father     Health Maintenance  Topic Date Due   COVID-19 Vaccine (1) Never done   HIV Screening  Never done   Hepatitis C Screening  Never done   DTaP/Tdap/Td (1 - Tdap) Never done   Zoster Vaccines- Shingrix (1 of 2) Never done   Colonoscopy  07/06/2021   Medicare Annual Wellness (AWV)  04/07/2023   INFLUENZA VACCINE  11/19/2023   Hepatitis B Vaccines  Aged Out   HPV VACCINES  Aged Out   Meningococcal B Vaccine  Aged Out      ----------------------------------------------------------------------------------------------------------------------------------------------------------------------------------------------------------------- Physical Exam BP 102/63 (BP Location: Right Arm, Patient Position: Sitting, Cuff Size: Normal)   Pulse (!) 58   Ht 5' 10.5 (1.791 m)   Wt 191 lb (86.6 kg)   SpO2 96%   BMI 27.02 kg/m   Physical Exam Constitutional:      Appearance: Normal appearance.   Eyes:     General: No scleral icterus.   Cardiovascular:     Rate and Rhythm: Normal rate and regular rhythm.  Pulmonary:     Effort: Pulmonary effort is normal.     Breath sounds: Normal breath sounds.   Musculoskeletal:     Cervical back: Neck supple.   Neurological:     Mental Status: He is alert.   Psychiatric:        Mood and Affect: Mood normal.        Behavior: Behavior normal.     ------------------------------------------------------------------------------------------------------------------------------------------------------------------------------------------------------------------- Assessment and Plan  Essential hypertension Blood pressure mains well controlled current medications.  We will continue current medications at current strength.  Return in about 1 year (around 10/11/2024) for Hypertension.   Hyperlipidemia Update lipid panel today.    Meds ordered this encounter  Medications   lisinopril  (ZESTRIL ) 10 MG tablet    Sig: Take 1 tablet (10 mg total) by mouth daily.    Dispense:  90 tablet    Refill:  3    Return in about 1 year (around 10/11/2024) for Hypertension.

## 2023-12-27 ENCOUNTER — Emergency Department (HOSPITAL_COMMUNITY)
Admission: EM | Admit: 2023-12-27 | Discharge: 2023-12-27 | Disposition: A | Discharge status: Home / Self Care | Attending: Emergency Medicine | Admitting: Emergency Medicine

## 2023-12-27 ENCOUNTER — Emergency Department (HOSPITAL_COMMUNITY)

## 2023-12-27 ENCOUNTER — Other Ambulatory Visit: Payer: Self-pay

## 2023-12-27 ENCOUNTER — Encounter (HOSPITAL_COMMUNITY): Payer: Self-pay

## 2023-12-27 DIAGNOSIS — R109 Unspecified abdominal pain: Secondary | ICD-10-CM | POA: Insufficient documentation

## 2023-12-27 DIAGNOSIS — M549 Dorsalgia, unspecified: Secondary | ICD-10-CM | POA: Diagnosis present

## 2023-12-27 DIAGNOSIS — M545 Low back pain, unspecified: Secondary | ICD-10-CM

## 2023-12-27 LAB — URINALYSIS, W/ REFLEX TO CULTURE (INFECTION SUSPECTED)
Bacteria, UA: NONE SEEN
Bilirubin Urine: NEGATIVE
Glucose, UA: NEGATIVE mg/dL
Hgb urine dipstick: NEGATIVE
Ketones, ur: NEGATIVE mg/dL
Leukocytes,Ua: NEGATIVE
Nitrite: NEGATIVE
Protein, ur: NEGATIVE mg/dL
Specific Gravity, Urine: 1.002 — ABNORMAL LOW (ref 1.005–1.030)
pH: 6 (ref 5.0–8.0)

## 2023-12-27 LAB — I-STAT CHEM 8, ED
BUN: 13 mg/dL (ref 8–23)
Calcium, Ion: 1.18 mmol/L (ref 1.15–1.40)
Chloride: 101 mmol/L (ref 98–111)
Creatinine, Ser: 0.9 mg/dL (ref 0.61–1.24)
Glucose, Bld: 91 mg/dL (ref 70–99)
HCT: 46 % (ref 39.0–52.0)
Hemoglobin: 15.6 g/dL (ref 13.0–17.0)
Potassium: 4.1 mmol/L (ref 3.5–5.1)
Sodium: 139 mmol/L (ref 135–145)
TCO2: 27 mmol/L (ref 22–32)

## 2023-12-27 MED ORDER — LIDOCAINE 5 % EX PTCH: 1.0000 | MEDICATED_PATCH | CUTANEOUS | 0 refills | Status: AC

## 2023-12-27 MED ORDER — IBUPROFEN 400 MG PO TABS: 400.0000 mg | ORAL_TABLET | Freq: Four times a day (QID) | ORAL | 0 refills | Status: AC | PRN

## 2023-12-27 MED ORDER — METHOCARBAMOL 500 MG PO TABS: 500.0000 mg | ORAL_TABLET | Freq: Two times a day (BID) | ORAL | 0 refills | Status: AC

## 2023-12-27 NOTE — ED Provider Notes (Signed)
 [place] EMERGENCY DEPARTMENT AT [facility] Provider Note   CSN: [id number] Arrival date & time: 12/27/23  1007     Patient presents with: Back Pain   Ian [name] is a 63 y.o. male.   63 year old male presents with back pain x 1 week.  Pain has been atraumatic.  Characterizes a dull ache to his left flank without radiation.  No urinary symptoms.  Denies any rashes to the area.  Unrelieved with home medication.  Pain is somewhat positional.  Have a history of lumbar degenerative disc disease       Prior to Admission medications   Medication Sig Start Date End Date Taking? Authorizing Provider  Ascorbic Acid (VITAMIN C) 1000 MG tablet Take 1,000 mg by mouth daily.    [provider]  Cholecalciferol (VITAMIN D3 MAXIMUM STRENGTH) 125 MCG (5000 UT) capsule Take 5,000 Units by mouth daily.    [provider]  lisinopril  (ZESTRIL ) 10 MG tablet Take 1 tablet (10 mg total) by mouth daily. 10/12/23   Alvia Bring, DO  nitrofurantoin , macrocrystal-monohydrate, (MACROBID ) 100 MG capsule Take 1 capsule (100 mg total) by mouth at bedtime. 10/05/23   Nieves Cough, MD  Zinc 50 MG TABS Take 50 mg by mouth daily.    [provider]    Allergies: Other    Review of Systems  All other systems reviewed and are negative.   Updated Vital Signs BP (!) 154/101 (BP Location: Right Arm)   Pulse 72   Temp 97.8 F (36.6 C) (Oral)   Resp 17   SpO2 100%   Physical Exam Vitals and nursing note reviewed.  Constitutional:      General: He is not in acute distress.    Appearance: Normal appearance. He is well-developed. He is not toxic-appearing.  HENT:     Head: Normocephalic and atraumatic.  Eyes:     General: Lids are normal.     Conjunctiva/sclera: Conjunctivae normal.     Pupils: Pupils are equal, round, and reactive to light.  Neck:     Thyroid: No thyroid mass.     Trachea: No tracheal deviation.  Cardiovascular:     Rate and Rhythm: Normal  rate and regular rhythm.     Heart sounds: Normal heart sounds. No murmur heard.    No gallop.  Pulmonary:     Effort: Pulmonary effort is normal. No respiratory distress.     Breath sounds: Normal breath sounds. No stridor. No decreased breath sounds, wheezing, rhonchi or rales.  Abdominal:     General: There is no distension.     Palpations: Abdomen is soft.     Tenderness: There is no abdominal tenderness. There is no rebound.  Musculoskeletal:        General: No tenderness. Normal range of motion.     Cervical back: Normal range of motion and neck supple.       Back:  Skin:    General: Skin is warm and dry.     Findings: No abrasion or rash.  Neurological:     Mental Status: He is alert and oriented to person, place, and time. Mental status is at baseline.     GCS: GCS eye subscore is 4. GCS verbal subscore is 5. GCS motor subscore is 6.     Cranial Nerves: No cranial nerve deficit.     Sensory: No sensory deficit.     Motor: Motor function is intact.  Psychiatric:        Attention  and Perception: Attention normal.        Speech: Speech normal.        Behavior: Behavior normal.     (all labs ordered are listed, but only abnormal results are displayed) Labs Reviewed  URINALYSIS, W/ REFLEX TO CULTURE (INFECTION SUSPECTED)    EKG: None  Radiology: No results found.   Procedures   Medications Ordered in the ED - No data to display                                  Medical Decision Making Amount and/or Complexity of Data Reviewed Radiology: ordered.   Patient with urinalysis that was negative here.  His i-STAT 8 showed normal kidney function.  CT renal stone without acute evidence of nephrolithiasis.  Lumbar spine series shows no evidence of compression fracture.  Does show some degenerative changes.  Will prescribe patient lidocaine  patch along with NSAIDs for his discomfort     Final diagnoses:  None    ED Discharge Orders     None           Dasie Faden, MD 12/27/23 1329

## 2023-12-27 NOTE — ED Triage Notes (Addendum)
 Pt c/o lower left back pain since Sunday. States it hurts to move. He had a hard time getting out of bed, standing up straight. Denies any painful urination. Pt did have a recent fall.
